# Patient Record
Sex: Female | Born: 1995 | Race: Black or African American | Hispanic: No | Marital: Single | Smoking: Never smoker
Health system: Southern US, Community
[De-identification: ages and names within clinical notes are randomized; demographics above are authoritative.]

## PROBLEM LIST (undated history)

## (undated) DIAGNOSIS — O139 Gestational [pregnancy-induced] hypertension without significant proteinuria, unspecified trimester: Secondary | ICD-10-CM

## (undated) DIAGNOSIS — O039 Complete or unspecified spontaneous abortion without complication: Secondary | ICD-10-CM

## (undated) HISTORY — DX: Complete or unspecified spontaneous abortion without complication: O03.9

## (undated) HISTORY — PX: DILATION AND CURETTAGE OF UTERUS: SHX78

## (undated) HISTORY — PX: WISDOM TOOTH EXTRACTION: SHX21

---

## 2014-09-08 ENCOUNTER — Encounter (HOSPITAL_COMMUNITY): Payer: Self-pay | Admitting: Nurse Practitioner

## 2014-09-08 ENCOUNTER — Emergency Department (HOSPITAL_COMMUNITY)
Admission: EM | Admit: 2014-09-08 | Discharge: 2014-09-08 | Disposition: A | Discharge status: Home / Self Care | Payer: Self-pay | Attending: Emergency Medicine | Admitting: Emergency Medicine

## 2014-09-08 DIAGNOSIS — R5383 Other fatigue: Secondary | ICD-10-CM | POA: Insufficient documentation

## 2014-09-08 DIAGNOSIS — R112 Nausea with vomiting, unspecified: Secondary | ICD-10-CM | POA: Insufficient documentation

## 2014-09-08 DIAGNOSIS — R51 Headache: Secondary | ICD-10-CM | POA: Insufficient documentation

## 2014-09-08 DIAGNOSIS — E860 Dehydration: Secondary | ICD-10-CM | POA: Insufficient documentation

## 2014-09-08 LAB — URINALYSIS, ROUTINE W REFLEX MICROSCOPIC
Bilirubin Urine: NEGATIVE
GLUCOSE, UA: NEGATIVE mg/dL
HGB URINE DIPSTICK: NEGATIVE
KETONES UR: 15 mg/dL — AB
NITRITE: NEGATIVE
PH: 6 (ref 5.0–8.0)
Protein, ur: NEGATIVE mg/dL
Specific Gravity, Urine: 1.026 (ref 1.005–1.030)
UROBILINOGEN UA: 0.2 mg/dL (ref 0.0–1.0)

## 2014-09-08 LAB — BASIC METABOLIC PANEL
Anion gap: 13 (ref 5–15)
BUN: 8 mg/dL (ref 6–20)
CO2: 21 mmol/L — ABNORMAL LOW (ref 22–32)
Calcium: 9.1 mg/dL (ref 8.9–10.3)
Chloride: 104 mmol/L (ref 101–111)
Creatinine, Ser: 1.12 mg/dL — ABNORMAL HIGH (ref 0.44–1.00)
GFR calc non Af Amer: >60 mL/min (ref >60)
Glucose, Bld: 136 mg/dL — ABNORMAL HIGH (ref 65–99)
Potassium: 3.9 mmol/L (ref 3.5–5.1)
Sodium: 138 mmol/L (ref 135–145)

## 2014-09-08 LAB — I-STAT CHEM 8, ED
BUN: 8 mg/dL (ref 6–20)
Calcium, Ion: 1.18 mmol/L (ref 1.12–1.23)
Chloride: 104 mmol/L (ref 101–111)
Creatinine, Ser: 1 mg/dL (ref 0.44–1.00)
Glucose, Bld: 132 mg/dL — ABNORMAL HIGH (ref 65–99)
HCT: 50 % — ABNORMAL HIGH (ref 36.0–46.0)
Hemoglobin: 17 g/dL — ABNORMAL HIGH (ref 12.0–15.0)
Potassium: 3.9 mmol/L (ref 3.5–5.1)
SODIUM: 141 mmol/L (ref 135–145)
TCO2: 20 mmol/L (ref 0–100)

## 2014-09-08 LAB — URINE MICROSCOPIC-ADD ON

## 2014-09-08 LAB — CBC
HEMATOCRIT: 43.5 % (ref 36.0–46.0)
HEMOGLOBIN: 14.5 g/dL (ref 12.0–15.0)
MCH: 25.8 pg — AB (ref 26.0–34.0)
MCHC: 33.3 g/dL (ref 30.0–36.0)
MCV: 77.5 fL — ABNORMAL LOW (ref 78.0–100.0)
Platelets: 279 10*3/uL (ref 150–400)
RBC: 5.61 MIL/uL — ABNORMAL HIGH (ref 3.87–5.11)
RDW: 13.6 % (ref 11.5–15.5)
WBC: 22.7 10*3/uL — ABNORMAL HIGH (ref 4.0–10.5)

## 2014-09-08 LAB — I-STAT BETA HCG BLOOD, ED (MC, WL, AP ONLY): I-stat hCG, quantitative: <5 m[IU]/mL (ref <5)

## 2014-09-08 MED ORDER — ONDANSETRON HCL 4 MG/2ML IJ SOLN
4.0000 mg | Freq: Once | INTRAMUSCULAR | Status: AC
  Administered 2014-09-08: 4 mg via INTRAVENOUS
  Filled 2014-09-08: qty 2

## 2014-09-08 MED ORDER — ONDANSETRON 4 MG PO TBDP
ORAL_TABLET | ORAL | Status: AC
  Filled 2014-09-08: qty 1

## 2014-09-08 MED ORDER — PROMETHAZINE HCL 25 MG/ML IJ SOLN
25.0000 mg | Freq: Once | INTRAMUSCULAR | Status: AC
  Administered 2014-09-08: 25 mg via INTRAVENOUS
  Filled 2014-09-08: qty 1

## 2014-09-08 MED ORDER — ONDANSETRON 4 MG PO TBDP
4.0000 mg | ORAL_TABLET | Freq: Once | ORAL | Status: AC | PRN
  Administered 2014-09-08: 4 mg via ORAL

## 2014-09-08 MED ORDER — ACETAMINOPHEN 500 MG PO TABS
1000.0000 mg | ORAL_TABLET | Freq: Once | ORAL | Status: AC
  Administered 2014-09-08: 1000 mg via ORAL
  Filled 2014-09-08: qty 2

## 2014-09-08 MED ORDER — ONDANSETRON 4 MG PO TBDP: ORAL_TABLET | ORAL | Status: DC

## 2014-09-08 MED ORDER — METOCLOPRAMIDE HCL 5 MG/ML IJ SOLN
10.0000 mg | Freq: Once | INTRAMUSCULAR | Status: AC
  Administered 2014-09-08: 10 mg via INTRAVENOUS

## 2014-09-08 MED ORDER — SODIUM CHLORIDE 0.9 % IV BOLUS (SEPSIS)
1000.0000 mL | Freq: Once | INTRAVENOUS | Status: AC
  Administered 2014-09-08: 1000 mL via INTRAVENOUS

## 2014-09-08 NOTE — ED Provider Notes (Signed)
CSN: [id number]     Arrival date & time 09/08/14  1809 History   First MD Initiated Contact with Patient 09/08/14 1830     Chief Complaint  Patient presents with  . Emesis     (Consider location/radiation/quality/duration/timing/severity/associated sxs/prior Treatment) HPI Comments: 19 year old female presenting with nausea and vomiting. Patient states she gave plasma earlier today, and then walked for about 2-3 hours in the heat without any water until she reached the tire store. While at the tire store, she started to feel nauseated, had 2 episodes of nonbloody, nonbilious emesis, developed generalized headache and fatigue and came to the ED where she had 2 more episodes of vomiting. Prior to giving plasma, she was feeling fine, and gives plasma frequently without any problem. She normally does not walk outside in the heat for hours after giving plasma. Denies abdominal pain, fever, chills, chest pain, shortness of breath, lightheadedness, dizziness or vision change.  Patient is a 19 y.o. female presenting with vomiting. The history is provided by the patient.  Emesis Associated symptoms: headaches     History reviewed. No pertinent past medical history. History reviewed. No pertinent past surgical history. History reviewed. No pertinent family history. History  Substance Use Topics  . Smoking status: Never Smoker   . Smokeless tobacco: Not on file  . Alcohol Use: No   OB History    No data available     Review of Systems  Constitutional: Positive for fatigue.  Gastrointestinal: Positive for nausea and vomiting.  Neurological: Positive for headaches.  All other systems reviewed and are negative.     Allergies  Review of patient's allergies indicates no known allergies.  Home Medications   Prior to Admission medications   Medication Sig Start Date End Date Taking? Authorizing Provider  etonogestrel-ethinyl estradiol (NUVARING) 0.12-0.015 MG/24HR vaginal ring Place 1  each vaginally every 28 (twenty-eight) days. Insert vaginally and leave in place for 3 consecutive weeks, then remove for 1 week.   Yes Historical Provider, MD  ondansetron (ZOFRAN ODT) 4 MG disintegrating tablet 4mg  ODT q4 hours prn nausea/vomit 09/08/14   Madylyn Insco M Clemma Johnsen, PA-C   BP 123/72 mmHg  Pulse 75  Temp(Src) 98 F (36.7 C) (Rectal)  Resp 16  SpO2 100% Physical Exam  Constitutional: She is oriented to person, place, and time. She appears well-developed and well-nourished. No distress.  HENT:  Head: Normocephalic and atraumatic.  Mouth/Throat: Oropharynx is clear and moist.  Eyes: Conjunctivae and EOM are normal. Pupils are equal, round, and reactive to light.  Neck: Normal range of motion. Neck supple.  Cardiovascular: Normal rate, regular rhythm and normal heart sounds.   Pulmonary/Chest: Effort normal and breath sounds normal. No respiratory distress.  Abdominal: Soft. Bowel sounds are normal. She exhibits no distension. There is no tenderness.  Musculoskeletal: Normal range of motion. She exhibits no edema.  Neurological: She is alert and oriented to person, place, and time. She has normal strength. No cranial nerve deficit or sensory deficit. Coordination normal. GCS eye subscore is 4. GCS verbal subscore is 5. GCS motor subscore is 6.  Speech fluent, goal oriented. Moves extremities without ataxia.  Skin: Skin is warm and dry.  Psychiatric: She has a normal mood and affect. Her behavior is normal.  Nursing note and vitals reviewed.   ED Course  Procedures (including critical care time) Labs Review Labs Reviewed  CBC - Abnormal; Notable for the following:    WBC 22.7 (*)    RBC 5.61 (*)  MCV 77.5 (*)    MCH 25.8 (*)    All other components within normal limits  URINALYSIS, ROUTINE W REFLEX MICROSCOPIC (NOT AT [facility]) - Abnormal; Notable for the following:    APPearance TURBID (*)    Ketones, ur 15 (*)    Leukocytes, UA SMALL (*)    All other components within normal  limits  BASIC METABOLIC PANEL - Abnormal; Notable for the following:    CO2 21 (*)    Glucose, Bld 136 (*)    Creatinine, Ser 1.12 (*)    All other components within normal limits  URINE MICROSCOPIC-ADD ON - Abnormal; Notable for the following:    Squamous Epithelial / LPF MANY (*)    Bacteria, UA MANY (*)    Casts GRANULAR CAST (*)    All other components within normal limits  I-STAT CHEM 8, ED - Abnormal; Notable for the following:    Glucose, Bld 132 (*)    Hemoglobin 17.0 (*)    HCT 50.0 (*)    All other components within normal limits  I-STAT BETA HCG BLOOD, ED ([facility], [facility], [facility] ONLY)    Imaging Review No results found.   EKG Interpretation None      MDM   Final diagnoses:  Dehydration  Non-intractable vomiting with nausea, vomiting of unspecified type   Non-toxic appearing, NAD. AFVSS. Pt with emesis and HA after walking in the heat for 2-3 hours. Most likely from dehydration. Will give IV fluids, zofran, and check basic labs. Abdomen soft and nontender.  Labs significant for a leukocytosis of 22.7, this is most likely from the dehydration along with a creatinine of 1.12. Patient given 2 L of IV fluids and antibiotics and is resting comfortably on exam bed. When waking patient up, she states she is feeling better. She is tolerating PO. Vital signs remain stable. Stable for discharge. Return precautions given. Patient states understanding of treatment care plan and is agreeable.  Discussed with attending Dr. Hyacinth Meeker who also evaluated patient and agrees with plan of care.  Kathrynn Speed, PA-C 09/08/14 2219  Eber Hong, MD 09/09/14 1040

## 2014-09-08 NOTE — Discharge Instructions (Signed)
Take Zofran as directed as needed for nausea. Stay well hydrated.  Dehydration, Adult Dehydration is when you lose more fluids from the body than you take in. Vital organs like the kidneys, brain, and heart cannot function without a proper amount of fluids and salt. Any loss of fluids from the body can cause dehydration.  CAUSES   Vomiting.  Diarrhea.  Excessive sweating.  Excessive urine output.  Fever. SYMPTOMS  Mild dehydration  Thirst.  Dry lips.  Slightly dry mouth. Moderate dehydration  Very dry mouth.  Sunken eyes.  Skin does not bounce back quickly when lightly pinched and released.  Dark urine and decreased urine production.  Decreased tear production.  Headache. Severe dehydration  Very dry mouth.  Extreme thirst.  Rapid, weak pulse (more than 100 beats per minute at rest).  Cold hands and feet.  Not able to sweat in spite of heat and temperature.  Rapid breathing.  Blue lips.  Confusion and lethargy.  Difficulty being awakened.  Minimal urine production.  No tears. DIAGNOSIS  Your caregiver will diagnose dehydration based on your symptoms and your exam. Blood and urine tests will help confirm the diagnosis. The diagnostic evaluation should also identify the cause of dehydration. TREATMENT  Treatment of mild or moderate dehydration can often be done at home by increasing the amount of fluids that you drink. It is best to drink small amounts of fluid more often. Drinking too much at one time can make vomiting worse. Refer to the home care instructions below. Severe dehydration needs to be treated at the hospital where you will probably be given intravenous (IV) fluids that contain water and electrolytes. HOME CARE INSTRUCTIONS   Ask your caregiver about specific rehydration instructions.  Drink enough fluids to keep your urine clear or pale yellow.  Drink small amounts frequently if you have nausea and vomiting.  Eat as you normally  do.  Avoid:  Foods or drinks high in sugar.  Carbonated drinks.  Juice.  Extremely hot or cold fluids.  Drinks with caffeine.  Fatty, greasy foods.  Alcohol.  Tobacco.  Overeating.  Gelatin desserts.  Wash your hands well to avoid spreading bacteria and viruses.  Only take over-the-counter or prescription medicines for pain, discomfort, or fever as directed by your caregiver.  Ask your caregiver if you should continue all prescribed and over-the-counter medicines.  Keep all follow-up appointments with your caregiver. SEEK MEDICAL CARE IF:  You have abdominal pain and it increases or stays in one area (localizes).  You have a rash, stiff neck, or severe headache.  You are irritable, sleepy, or difficult to awaken.  You are weak, dizzy, or extremely thirsty. SEEK IMMEDIATE MEDICAL CARE IF:   You are unable to keep fluids down or you get worse despite treatment.  You have frequent episodes of vomiting or diarrhea.  You have blood or green matter (bile) in your vomit.  You have blood in your stool or your stool looks black and tarry.  You have not urinated in 6 to 8 hours, or you have only urinated a small amount of very dark urine.  You have a fever.  You faint. MAKE SURE YOU:   Understand these instructions.  Will watch your condition.  Will get help right away if you are not doing well or get worse. Document Released: 02/03/2005 Document Revised: 04/28/2011 Document Reviewed: 09/23/2010 [facility] Patient Information 2015 [place], [place]. This information is not intended to replace advice given to you by your health care provider.  Make sure you discuss any questions you have with your health care provider. ° °

## 2014-09-08 NOTE — ED Notes (Addendum)
She gave plasma today then she walked in the heat 3 miles then began to have a headache and vomiting. She is alert, lying on the floor in triage heaving.

## 2014-09-08 NOTE — ED Notes (Signed)
The patient is aware that a urine specimen is needed. 

## 2014-09-08 NOTE — ED Notes (Signed)
We attempted orthostatic vitals but the patient was only able to do the laying portion.

## 2014-09-08 NOTE — ED Provider Notes (Signed)
19 year old female, presents with feeling lightheaded, nauseated, headache.  She denies any abdominal chest or back pain, has no neck stiffness, has persistent nausea and multiple episodes of vomiting which has occurred throughout the day. She denies any recent insect bites or swelling of the legs. There has been no diarrhea or dysuria. She states that she had a very small amount of food this morning followed by donating plasma for money, she then walked 3 miles in the hot sun where she became progressively more nauseated. On exam she has a soft nontender abdomen, clear heart and lung sounds, she appears tired but is able to follow my commands without difficult. She is persistently nauseated and feels as though she's been vomiting. Initial labs show significant leukocytosis of 22,000, check renal function, urinalysis, electrolytes, hydrate, reevaluate.  Medical screening examination/treatment/procedure(s) were conducted as a shared visit with non-physician practitioner(s) and myself.  I personally evaluated the patient during the encounter.  Clinical Impression:   Final diagnoses:  Dehydration  Non-intractable vomiting with nausea, vomiting of unspecified type          Eber Hong, MD 09/09/14 1040

## 2014-09-08 NOTE — ED Notes (Signed)
Pt reports nausea continues, states she hasn't been able to keep water down. No emesis seen by this RN this shift.

## 2014-09-08 NOTE — ED Notes (Addendum)
Prompted pt to provide urine sample, pt doesn't think she can go, given glass of water to drink and try later. Refsued in and out cath.

## 2016-12-05 ENCOUNTER — Encounter: Payer: Self-pay | Admitting: Family Medicine

## 2016-12-05 ENCOUNTER — Ambulatory Visit (INDEPENDENT_AMBULATORY_CARE_PROVIDER_SITE_OTHER): Payer: Medicaid Other | Admitting: Family Medicine

## 2016-12-05 ENCOUNTER — Other Ambulatory Visit (HOSPITAL_COMMUNITY)
Admission: RE | Admit: 2016-12-05 | Discharge: 2016-12-05 | Disposition: A | Discharge status: Home / Self Care | Payer: Medicaid Other | Source: Ambulatory Visit | Attending: Family Medicine | Admitting: Family Medicine

## 2016-12-05 ENCOUNTER — Inpatient Hospital Stay: Admission: RE | Admit: 2016-12-05 | Payer: Self-pay | Source: Ambulatory Visit

## 2016-12-05 DIAGNOSIS — Z3400 Encounter for supervision of normal first pregnancy, unspecified trimester: Secondary | ICD-10-CM | POA: Insufficient documentation

## 2016-12-05 DIAGNOSIS — Z113 Encounter for screening for infections with a predominantly sexual mode of transmission: Secondary | ICD-10-CM

## 2016-12-05 DIAGNOSIS — T7491XA Unspecified adult maltreatment, confirmed, initial encounter: Secondary | ICD-10-CM

## 2016-12-05 DIAGNOSIS — Z3401 Encounter for supervision of normal first pregnancy, first trimester: Secondary | ICD-10-CM

## 2016-12-05 DIAGNOSIS — Z124 Encounter for screening for malignant neoplasm of cervix: Secondary | ICD-10-CM

## 2016-12-05 NOTE — Patient Instructions (Signed)
First Trimester of Pregnancy The first trimester of pregnancy is from week 1 until the end of week 13 (months 1 through 3). A week after a sperm fertilizes an egg, the egg will implant on the wall of the uterus. This embryo will begin to develop into a baby. Genes from you and your partner will form the baby. The female genes will determine whether the baby will be a boy or a girl. At 6-8 weeks, the eyes and face will be formed, and the heartbeat can be seen on ultrasound. At the end of 12 weeks, all the baby's organs will be formed. Now that you are pregnant, you will want to do everything you can to have a healthy baby. Two of the most important things are to get good prenatal care and to follow your health care provider's instructions. Prenatal care is all the medical care you receive before the baby's birth. This care will help prevent, find, and treat any problems during the pregnancy and childbirth. Body changes during your first trimester Your body goes through many changes during pregnancy. The changes vary from woman to woman.  You may gain or lose a couple of pounds at first.  You may feel sick to your stomach (nauseous) and you may throw up (vomit). If the vomiting is uncontrollable, call your health care provider.  You may tire easily.  You may develop headaches that can be relieved by medicines. All medicines should be approved by your health care provider.  You may urinate more often. Painful urination may mean you have a bladder infection.  You may develop heartburn as a result of your pregnancy.  You may develop constipation because certain hormones are causing the muscles that push stool through your intestines to slow down.  You may develop hemorrhoids or swollen veins (varicose veins).  Your breasts may begin to grow larger and become tender. Your nipples may stick out more, and the tissue that surrounds them (areola) may become darker.  Your gums may bleed and may be  sensitive to brushing and flossing.  Dark spots or blotches (chloasma, mask of pregnancy) may develop on your face. This will likely fade after the baby is born.  Your menstrual periods will stop.  You may have a loss of appetite.  You may develop cravings for certain kinds of food.  You may have changes in your emotions from day to day, such as being excited to be pregnant or being concerned that something may go wrong with the pregnancy and baby.  You may have more vivid and strange dreams.  You may have changes in your hair. These can include thickening of your hair, rapid growth, and changes in texture. Some women also have hair loss during or after pregnancy, or hair that feels dry or thin. Your hair will most likely return to normal after your baby is born.  What to expect at prenatal visits During a routine prenatal visit:  You will be weighed to make sure you and the baby are growing normally.  Your blood pressure will be taken.  Your abdomen will be measured to track your baby's growth.  The fetal heartbeat will be listened to between weeks 10 and 14 of your pregnancy.  Test results from any previous visits will be discussed.  Your health care provider may ask you:  How you are feeling.  If you are feeling the baby move.  If you have had any abnormal symptoms, such as leaking fluid, bleeding, severe headaches,   or abdominal cramping.  If you are using any tobacco products, including cigarettes, chewing tobacco, and electronic cigarettes.  If you have any questions.  Other tests that may be performed during your first trimester include:  Blood tests to find your blood type and to check for the presence of any previous infections. The tests will also be used to check for low iron levels (anemia) and protein on red blood cells (Rh antibodies). Depending on your risk factors, or if you previously had diabetes during pregnancy, you may have tests to check for high blood  sugar that affects pregnant women (gestational diabetes).  Urine tests to check for infections, diabetes, or protein in the urine.  An ultrasound to confirm the proper growth and development of the baby.  Fetal screens for spinal cord problems (spina bifida) and Down syndrome.  HIV (human immunodeficiency virus) testing. Routine prenatal testing includes screening for HIV, unless you choose not to have this test.  You may need other tests to make sure you and the baby are doing well.  Follow these instructions at home: Medicines  Follow your health care provider's instructions regarding medicine use. Specific medicines may be either safe or unsafe to take during pregnancy.  Take a prenatal vitamin that contains at least 600 micrograms (mcg) of folic acid.  If you develop constipation, try taking a stool softener if your health care provider approves. Eating and drinking  Eat a balanced diet that includes fresh fruits and vegetables, whole grains, good sources of protein such as meat, eggs, or tofu, and low-fat dairy. Your health care provider will help you determine the amount of weight gain that is right for you.  Avoid raw meat and uncooked cheese. These carry germs that can cause birth defects in the baby.  Eating four or five small meals rather than three large meals a day may help relieve nausea and vomiting. If you start to feel nauseous, eating a few soda crackers can be helpful. Drinking liquids between meals, instead of during meals, also seems to help ease nausea and vomiting.  Limit foods that are high in fat and processed sugars, such as fried and sweet foods.  To prevent constipation: ? Eat foods that are high in fiber, such as fresh fruits and vegetables, whole grains, and beans. ? Drink enough fluid to keep your urine clear or pale yellow. Activity  Exercise only as directed by your health care provider. Most women can continue their usual exercise routine during  pregnancy. Try to exercise for 30 minutes at least 5 days a week. Exercising will help you: ? Control your weight. ? Stay in shape. ? Be prepared for labor and delivery.  Experiencing pain or cramping in the lower abdomen or lower back is a good sign that you should stop exercising. Check with your health care provider before continuing with normal exercises.  Try to avoid standing for long periods of time. Move your legs often if you must stand in one place for a long time.  Avoid heavy lifting.  Wear low-heeled shoes and practice good posture.  You may continue to have sex unless your health care provider tells you not to. Relieving pain and discomfort  Wear a good support bra to relieve breast tenderness.  Take warm sitz baths to soothe any pain or discomfort caused by hemorrhoids. Use hemorrhoid cream if your health care provider approves.  Rest with your legs elevated if you have leg cramps or low back pain.  If you develop   varicose veins in your legs, wear support hose. Elevate your feet for 15 minutes, 3-4 times a day. Limit salt in your diet. Prenatal care  Schedule your prenatal visits by the twelfth week of pregnancy. They are usually scheduled monthly at first, then more often in the last 2 months before delivery.  Write down your questions. Take them to your prenatal visits.  Keep all your prenatal visits as told by your health care provider. This is important. Safety  Wear your seat belt at all times when driving.  Make a list of emergency phone numbers, including numbers for family, friends, the hospital, and police and fire departments. General instructions  Ask your health care provider for a referral to a local prenatal education class. Begin classes no later than the beginning of month 6 of your pregnancy.  Ask for help if you have counseling or nutritional needs during pregnancy. Your health care provider can offer advice or refer you to specialists for help  with various needs.  Do not use hot tubs, steam rooms, or saunas.  Do not douche or use tampons or scented sanitary pads.  Do not cross your legs for long periods of time.  Avoid cat litter boxes and soil used by cats. These carry germs that can cause birth defects in the baby and possibly loss of the fetus by miscarriage or stillbirth.  Avoid all smoking, herbs, alcohol, and medicines not prescribed by your health care provider. Chemicals in these products affect the formation and growth of the baby.  Do not use any products that contain nicotine or tobacco, such as cigarettes and e-cigarettes. If you need help quitting, ask your health care provider. You may receive counseling support and other resources to help you quit.  Schedule a dentist appointment. At home, brush your teeth with a soft toothbrush and be gentle when you floss. Contact a health care provider if:  You have dizziness.  You have mild pelvic cramps, pelvic pressure, or nagging pain in the abdominal area.  You have persistent nausea, vomiting, or diarrhea.  You have a bad smelling vaginal discharge.  You have pain when you urinate.  You notice increased swelling in your face, hands, legs, or ankles.  You are exposed to fifth disease or chickenpox.  You are exposed to German measles (rubella) and have never had it. Get help right away if:  You have a fever.  You are leaking fluid from your vagina.  You have spotting or bleeding from your vagina.  You have severe abdominal cramping or pain.  You have rapid weight gain or loss.  You vomit blood or material that looks like coffee grounds.  You develop a severe headache.  You have shortness of breath.  You have any kind of trauma, such as from a fall or a car accident. Summary  The first trimester of pregnancy is from week 1 until the end of week 13 (months 1 through 3).  Your body goes through many changes during pregnancy. The changes vary from  woman to woman.  You will have routine prenatal visits. During those visits, your health care provider will examine you, discuss any test results you may have, and talk with you about how you are feeling. This information is not intended to replace advice given to you by your health care provider. Make sure you discuss any questions you have with your health care provider. Document Released: 01/28/2001 Document Revised: 01/16/2016 Document Reviewed: 01/16/2016 [employer] Interactive Patient Education  2017 [employer]  
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
## 2016-12-06 LAB — OBSTETRIC PANEL, INCLUDING HIV
Antibody Screen: NEGATIVE
Basophils Absolute: 0 10*3/uL (ref 0.0–0.2)
Basos: 0 %
EOS (ABSOLUTE): 0.1 10*3/uL (ref 0.0–0.4)
Eos: 1 %
HEP B S AG: NEGATIVE
HIV Screen 4th Generation wRfx: NONREACTIVE
Hematocrit: 37.2 % (ref 34.0–46.6)
Hemoglobin: 12.3 g/dL (ref 11.1–15.9)
IMMATURE GRANS (ABS): 0 10*3/uL (ref 0.0–0.1)
IMMATURE GRANULOCYTES: 1 %
LYMPHS: 26 %
Lymphocytes Absolute: 2.3 10*3/uL (ref 0.7–3.1)
MCH: 25.5 pg — ABNORMAL LOW (ref 26.6–33.0)
MCHC: 33.1 g/dL (ref 31.5–35.7)
MCV: 77 fL — ABNORMAL LOW (ref 79–97)
Monocytes Absolute: 0.4 10*3/uL (ref 0.1–0.9)
Monocytes: 4 %
NEUTROS PCT: 68 %
Neutrophils Absolute: 5.9 10*3/uL (ref 1.4–7.0)
Platelets: 294 10*3/uL (ref 150–379)
RBC: 4.82 x10E6/uL (ref 3.77–5.28)
RDW: 14.8 % (ref 12.3–15.4)
RPR Ser Ql: NONREACTIVE
Rh Factor: POSITIVE
Rubella Antibodies, IGG: 6.5 index (ref >0.99)
WBC: 8.6 10*3/uL (ref 3.4–10.8)

## 2016-12-06 LAB — HEMOGLOBIN A1C
Est. average glucose Bld gHb Est-mCnc: 103 mg/dL
HEMOGLOBIN A1C: 5.2 % (ref 4.8–5.6)

## 2016-12-08 LAB — CULTURE, URINE COMPREHENSIVE

## 2016-12-09 ENCOUNTER — Telehealth: Payer: Self-pay

## 2016-12-09 LAB — GC/CHLAMYDIA PROBE AMP (~~LOC~~) NOT AT ARMC
Chlamydia: NEGATIVE
Neisseria Gonorrhea: NEGATIVE

## 2016-12-09 LAB — CYTOLOGY - PAP: Diagnosis: NEGATIVE

## 2016-12-09 NOTE — Telephone Encounter (Signed)
Patient called stating she is 10 weeks and  has vomited today and saw blood in it. [facility]. Brandi StammerJennifer Howard RN BSN

## 2016-12-10 ENCOUNTER — Ambulatory Visit (INDEPENDENT_AMBULATORY_CARE_PROVIDER_SITE_OTHER): Payer: Medicaid Other

## 2016-12-10 VITALS — BP 112/68 | HR 78 | Wt 262.0 lb

## 2016-12-10 DIAGNOSIS — Z3687 Encounter for antenatal screening for uncertain dates: Secondary | ICD-10-CM

## 2016-12-10 DIAGNOSIS — Z3400 Encounter for supervision of normal first pregnancy, unspecified trimester: Secondary | ICD-10-CM

## 2016-12-10 DIAGNOSIS — Z3401 Encounter for supervision of normal first pregnancy, first trimester: Secondary | ICD-10-CM

## 2016-12-10 NOTE — Progress Notes (Signed)
Patient presents for viability ultrasound.  DATING AND VIABILITY SONOGRAM   Brandi Norris is a 21 y.o. year old G3P0020 with LMP Patient's last menstrual period was 09/27/2016 (exact date). which would correlate to  6827w4d weeks gestation.  She has regular menstrual cycles.   She is here today for a confirmatory initial sonogram.    GESTATION: singleton  FETAL ACTIVITY:          Heart rate        175 bpm          The fetus is active.  PLACENTA LOCALIZATION:  posterior   ADNEXA: The ovaries appear normal.   GESTATIONAL AGE AND  BIOMETRICS:  Gestational criteria: Estimated Date of Delivery: 07/04/17 by LMP now at 527w4d  Previous Scans:0      CROWN RUMP LENGTH           3.09 cm        10-1 weeks                                                                               AVERAGE EGA(BY THIS SCAN):  10-1 weeks  WORKING EDD( LMP ):  07-04-17   TECHNICIAN COMMENTS: Patient to return to office in four week for next OB visit. A copy of this report including all images has been saved and backed up to a second source for retrieval if needed. All measures and details of the anatomical scan, placentation, fluid volume and pelvic anatomy are contained in that report.  Armandina StammerJennifer Brittain Hosie 12/10/2016 10:11 AM

## 2017-01-02 ENCOUNTER — Encounter: Payer: Medicaid Other | Admitting: Family Medicine

## 2017-01-06 ENCOUNTER — Encounter: Payer: Self-pay | Admitting: Advanced Practice Midwife

## 2017-01-06 ENCOUNTER — Ambulatory Visit (INDEPENDENT_AMBULATORY_CARE_PROVIDER_SITE_OTHER): Payer: Medicaid Other | Admitting: Advanced Practice Midwife

## 2017-01-06 VITALS — BP 134/80 | HR 93 | Ht 66.0 in | Wt 254.0 lb

## 2017-01-06 DIAGNOSIS — Z3482 Encounter for supervision of other normal pregnancy, second trimester: Secondary | ICD-10-CM

## 2017-01-06 DIAGNOSIS — Z3490 Encounter for supervision of normal pregnancy, unspecified, unspecified trimester: Secondary | ICD-10-CM

## 2017-01-06 DIAGNOSIS — Z3400 Encounter for supervision of normal first pregnancy, unspecified trimester: Secondary | ICD-10-CM

## 2017-01-06 DIAGNOSIS — T7491XA Unspecified adult maltreatment, confirmed, initial encounter: Secondary | ICD-10-CM

## 2017-01-06 NOTE — Progress Notes (Signed)
.  mfm
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
## 2017-01-06 NOTE — Patient Instructions (Signed)
Second Trimester of Pregnancy The second trimester is from week 13 through week 28, month 4 through 6. This is often the time in pregnancy that you feel your best. Often times, morning sickness has lessened or quit. You may have more energy, and you may get hungry more often. Your unborn baby (fetus) is growing rapidly. At the end of the sixth month, he or she is about 9 inches long and weighs about 1 pounds. You will likely feel the baby move (quickening) between 18 and 20 weeks of pregnancy. Follow these instructions at home:  Avoid all smoking, herbs, and alcohol. Avoid drugs not approved by your doctor.  Do not use any tobacco products, including cigarettes, chewing tobacco, and electronic cigarettes. If you need help quitting, ask your doctor. You may get counseling or other support to help you quit.  Only take medicine as told by your doctor. Some medicines are safe and some are not during pregnancy.  Exercise only as told by your doctor. Stop exercising if you start having cramps.  Eat regular, healthy meals.  Wear a good support bra if your breasts are tender.  Do not use hot tubs, steam rooms, or saunas.  Wear your seat belt when driving.  Avoid raw meat, uncooked cheese, and liter boxes and soil used by cats.  Take your prenatal vitamins.  Take 1500-2000 milligrams of calcium daily starting at the 20th week of pregnancy until you deliver your baby.  Try taking medicine that helps you poop (stool softener) as needed, and if your doctor approves. Eat more fiber by eating fresh fruit, vegetables, and whole grains. Drink enough fluids to keep your pee (urine) clear or pale yellow.  Take warm water baths (sitz baths) to soothe pain or discomfort caused by hemorrhoids. Use hemorrhoid cream if your doctor approves.  If you have puffy, bulging veins (varicose veins), wear support hose. Raise (elevate) your feet for 15 minutes, 3-4 times a day. Limit salt in your diet.  Avoid heavy  lifting, wear low heals, and sit up straight.  Rest with your legs raised if you have leg cramps or low back pain.  Visit your dentist if you have not gone during your pregnancy. Use a soft toothbrush to brush your teeth. Be gentle when you floss.  You can have sex (intercourse) unless your doctor tells you not to.  Go to your doctor visits. Get help if:  You feel dizzy.  You have mild cramps or pressure in your lower belly (abdomen).  You have a nagging pain in your belly area.  You continue to feel sick to your stomach (nauseous), throw up (vomit), or have watery poop (diarrhea).  You have bad smelling fluid coming from your vagina.  You have pain with peeing (urination). Get help right away if:  You have a fever.  You are leaking fluid from your vagina.  You have spotting or bleeding from your vagina.  You have severe belly cramping or pain.  You lose or gain weight rapidly.  You have trouble catching your breath and have chest pain.  You notice sudden or extreme puffiness (swelling) of your face, hands, ankles, feet, or legs.  You have not felt the baby move in over an hour.  You have severe headaches that do not go away with medicine.  You have vision changes. This information is not intended to replace advice given to you by your health care provider. Make sure you discuss any questions you have with your health care   provider. Document Released: 04/30/2009 Document Revised: 07/12/2015 Document Reviewed: 04/06/2012 Elsevier Interactive Patient Education  2017 Elsevier Inc.  

## 2017-01-27 ENCOUNTER — Telehealth: Payer: Self-pay

## 2017-01-27 NOTE — Telephone Encounter (Signed)
Patient complaining of sharp pain in back and some numbness at times down one leg. Patient also complaining of vaginal pressure at times. Patient is 17.3 weeks. Denies any bleeding or leaking of fluid.  Explained saitica to the patient and round ligament pain and how these can cause pressure and pain she is describing. Encouraged patient to get off her feet as much as possible and encouraged to purchase a maternity belt and wear it. Brandi StammerJennifer Howard RNBSN

## 2017-01-29 ENCOUNTER — Encounter (HOSPITAL_COMMUNITY): Payer: Self-pay | Admitting: Advanced Practice Midwife

## 2017-01-30 ENCOUNTER — Ambulatory Visit (INDEPENDENT_AMBULATORY_CARE_PROVIDER_SITE_OTHER): Payer: Medicaid Other | Admitting: Obstetrics and Gynecology

## 2017-01-30 ENCOUNTER — Encounter: Payer: Self-pay | Admitting: Obstetrics and Gynecology

## 2017-01-30 ENCOUNTER — Encounter: Payer: Self-pay | Admitting: Family Medicine

## 2017-01-30 VITALS — BP 130/77 | HR 97 | Wt 250.0 lb

## 2017-01-30 DIAGNOSIS — Z3400 Encounter for supervision of normal first pregnancy, unspecified trimester: Secondary | ICD-10-CM

## 2017-01-30 MED ORDER — VITAFOL GUMMIES 3.33-0.333-34.8 MG PO CHEW: 2.0000 | CHEWABLE_TABLET | Freq: Every day | ORAL | 6 refills | Status: AC

## 2017-01-30 MED ORDER — COMFORT FIT MATERNITY SUPP MED MISC: 0 refills | Status: DC
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
## 2017-02-04 ENCOUNTER — Telehealth: Payer: Self-pay

## 2017-02-04 ENCOUNTER — Other Ambulatory Visit: Payer: Self-pay | Admitting: Obstetrics and Gynecology

## 2017-02-04 ENCOUNTER — Encounter: Payer: Self-pay | Admitting: Obstetrics and Gynecology

## 2017-02-04 ENCOUNTER — Encounter: Payer: Medicaid Other | Admitting: Obstetrics & Gynecology

## 2017-02-04 DIAGNOSIS — O280 Abnormal hematological finding on antenatal screening of mother: Secondary | ICD-10-CM | POA: Insufficient documentation

## 2017-02-04 DIAGNOSIS — Z3400 Encounter for supervision of normal first pregnancy, unspecified trimester: Secondary | ICD-10-CM

## 2017-02-04 LAB — AFP TETRA
DIA MOM VALUE: 3.16
DIA Value (EIA): 396.86 pg/mL
DSR (By Age)    1 IN: 1094
DSR (SECOND TRIMESTER) 1 IN: 252
Gestational Age: 17.6 WEEKS
MATERNAL AGE AT EDD: 23.3 a
MSAFP MOM: 1.34
MSAFP: 43.4 ng/mL
MSHCG Mom: 2.69
MSHCG: 55675 m[IU]/mL
Osb Risk: 8546
T18 (By Age): 1:4262 {titer}
Test Results:: POSITIVE — AB
WEIGHT: 250 [lb_av]
uE3 Mom: 0.69
uE3 Value: 0.73 ng/mL

## 2017-02-04 NOTE — Telephone Encounter (Signed)
Patient made aware of abnormal quad screen - that there is an increase risk of down syndrome. I explained that this does not mean that her baby has down syndrome but she will need genetic counseling at her appointment for her anatomy ultrasound on Friday. Patient crying on the phone. Listened to patient concerns and suggested she have a list of questions for the genetic counselor on Friday. Armandina StammerJennifer Howard RNBSN

## 2017-02-04 NOTE — Telephone Encounter (Signed)
-----   Message from Catalina AntiguaPeggy Constant, MD sent at 02/04/2017  8:38 AM EST ----- Please inform patient of abnormal quad screen- positive for down syndrome. Please explain that at this time we are not saying that her baby has down syndrome. All it means is that further evaluation via ultrasound and further blood test can be done to confirm this abnormal result.   Please add genetic counseling to her 12/21 ultrasound in order for her to review her options  Thanks  Gigi Gineggy

## 2017-02-06 ENCOUNTER — Ambulatory Visit (HOSPITAL_COMMUNITY)
Admission: RE | Admit: 2017-02-06 | Discharge: 2017-02-06 | Disposition: A | Discharge status: Home / Self Care | Payer: Medicaid Other | Source: Ambulatory Visit | Attending: Advanced Practice Midwife | Admitting: Advanced Practice Midwife

## 2017-02-06 ENCOUNTER — Encounter (HOSPITAL_COMMUNITY): Payer: Self-pay

## 2017-02-06 DIAGNOSIS — Z3A18 18 weeks gestation of pregnancy: Secondary | ICD-10-CM | POA: Insufficient documentation

## 2017-02-06 DIAGNOSIS — Z3490 Encounter for supervision of normal pregnancy, unspecified, unspecified trimester: Secondary | ICD-10-CM

## 2017-02-06 DIAGNOSIS — Z3689 Encounter for other specified antenatal screening: Secondary | ICD-10-CM | POA: Diagnosis present

## 2017-02-06 DIAGNOSIS — O99212 Obesity complicating pregnancy, second trimester: Secondary | ICD-10-CM | POA: Insufficient documentation

## 2017-02-06 DIAGNOSIS — O289 Unspecified abnormal findings on antenatal screening of mother: Secondary | ICD-10-CM

## 2017-02-06 DIAGNOSIS — O352XX0 Maternal care for (suspected) hereditary disease in fetus, not applicable or unspecified: Secondary | ICD-10-CM

## 2017-02-06 NOTE — Progress Notes (Signed)
Genetic Counseling  DOB: 1995-12-13 Referring Provider: Aviva SignsWilliams, Marie L, CNM Appointment Date: 02/06/2017 Attending: Particia NearingMartha Decker, MD  Ms. Brandi DecampDiosha Norris was seen for genetic counseling because of an increased risk for fetal Down syndrome based on a maternal serum Quad screen.  In summary:  Reviewed results of screening test  Increased risk for Down syndrome  Discussed additional screening options  NIPS - drawn today  Ultrasound - performed today  Discussed diagnostic testing options  Amniocentesis - declined  Reviewed family history concerns - family history of SCA and FOB is adopted  Discussed general population carrier screening options - drawn today  CF  SMA  Hemoglobinopathies  She was counseled regarding the screening result and the associated 1 in 252 risk for fetal Down syndrome.  We reviewed chromosomes, nondisjunction, and the common features and variable prognosis of Down syndrome.  In addition, we reviewed the screen adjusted reduction in risks for trisomy 18 and open neural tube defects.  We also discussed other explanations for a screen positive result including: a gestational dating error, differences in maternal metabolism, and normal variation.  We reviewed other available screening options including noninvasive prenatal screening (NIPS)/cell free DNA (cfDNA) screening, and detailed ultrasound.  She was counseled that screening tests are used to modify a patient's a priori risk for aneuploidy, typically based on age. This estimate provides a pregnancy specific risk assessment. We reviewed the benefits and limitations of each option. Specifically, we discussed the conditions for which each test screens, the detection rates, and false positive rates of each. She was also counseled regarding diagnostic testing via amniocentesis. We reviewed the approximate 1 in 300-500 risk for complications from amniocentesis, including spontaneous pregnancy loss. We discussed  the possible results that the tests might provide including: positive, negative, unanticipated, and no result. Finally, she was counseled regarding the cost of each option and potential out of pocket expenses. After consideration of all the options, she elected to proceed with NIPS.  Those results will be available in 8-10 days.    A complete ultrasound was performed today. The ultrasound report will be sent under separate cover. There were no visualized fetal anomalies or markers suggestive of aneuploidy. Diagnostic testing was declined today.  She understands that screening tests cannot rule out all birth defects or genetic syndromes. The patient was advised of this limitation and states she still does not want additional testing at this time.   Brandi Norris was provided with written information regarding cystic fibrosis (CF), spinal muscular atrophy (SMA) and hemoglobinopathies including the carrier frequency, availability of carrier screening and prenatal diagnosis if indicated.  In addition, we discussed that CF and hemoglobinopathies are routinely screened for as part of the [place] newborn screening panel.  After further discussion, she elected to pursue screening for CF, SMA and hemoglobinopathies.   Both family histories were reviewed and found to be contributory.  Brandi Norris reported that her father had twins from another relationship who both died of SIDS at 316 months of age.  Brandi Norris's mother had a daughter from another relationship who was stillborn at 766 months.  Brandi Norris believes that her mother, maternal grandmother and maternal great-grandmother all are carriers for sickle cell anemia.  Brandi Norris reported that the father of the baby is adopted and has no information on his biological family.  The remainder of the family histories were noncontributory for birth defects, intellectual disabilities or known genetic conditions. Without further information regarding the provided family history, an  accurate genetic risk  cannot be calculated. Further genetic counseling is warranted if more information is obtained.  Brandi Norris denied exposure to environmental toxins or chemical agents. She denied the use of alcohol, tobacco or street drugs. She denied significant viral illnesses during the course of her pregnancy. Her medical and surgical histories were noncontributory.   I counseled Brandi Norris for approximately 60 minutes regarding the above risks and available options.   Mady Gemmaaragh Kinda Pottle, MS,  Certified Genetic Counselor

## 2017-02-17 NOTE — L&D Delivery Note (Signed)
Delivery Note  Progressed quickly to complete dilation.and felt urge to push.  Vertex already quite low  Vitals:   07/01/17 0521 07/01/17 0531 07/01/17 0540 07/01/17 0544  BP: (!) 170/102 (!) 177/101 (!) 174/111 (!) 171/99  Pulse: (!) 110 (!) 118 (!) 117 (!) 118  Resp:  Temp:    99 F (37.2 C)  TempSrc:      SpO2:      Weight:      Height:       Nifedipine given for hypertension.  At 6:10 AM a viable and healthy female was delivered via Vaginal, Spontaneous (Presentation:LOA  ).  APGAR: , ; weight  .   Placenta status:Spontaneous and grossly intact with 3 vessel Cord:  with the following complications: tight nuchal cord x 2  Anesthesia:  Epidural Episiotomy: None Lacerations: 1st degree;Perineal Suture Repair: None, patient refused repair Est. Blood Loss (mL): 150  Mom to postpartum.  Baby to Couplet care / Skin to Skin.  Wynelle Bourgeois 07/01/2017, 6:57 AM  Please schedule this patient for Postpartum visit in: 1 week with the following provider: Any provider For C/S patients schedule nurse incision check in weeks 2 weeks: no High risk pregnancy complicated by: HTN Delivery mode:  SVD Anticipated Birth Control:  other/unsure PP Procedures needed: BP check  Schedule Integrated BH visit: no

## 2017-02-18 ENCOUNTER — Other Ambulatory Visit (HOSPITAL_COMMUNITY): Payer: Self-pay

## 2017-02-18 ENCOUNTER — Telehealth (HOSPITAL_COMMUNITY): Payer: Self-pay | Admitting: Genetics

## 2017-02-18 NOTE — Telephone Encounter (Signed)
Called Brandi Norris to discuss her prenatal cell free DNA test results.  Brandi Norris had Panorama testing through [facility].  Testing was offered because of an increased risk for Down syndrome from her Quad screen.   The patient was identified by name and DOB.  We reviewed that these are within normal limits, showing a less than 1 in 10,000 risk for trisomies 21, 18 and 13, and monosomy X (Turner syndrome).  In addition, the risk for triploidy and sex chromosome trisomies (47,XXX and 47,XXY) was also low risk.   We reviewed that this testing identifies > 99% of pregnancies with trisomy 2821, trisomy 5813, sex chromosome trisomies (47,XXX and 47,XXY), and triploidy. The detection rate for trisomy 18 is 96%.  The detection rate for monosomy X is ~92%.  The false positive rate is <0.1% for all conditions. Testing was also consistent with female fetal sex.  The patient did wish to know fetal sex.  She understands that this testing does not identify all genetic conditions.  All questions were answered to her satisfaction, she was encouraged to call with additional questions or concerns.  Brandi Gemmaaragh Valree Feild, MS Certified Genetic Counselor

## 2017-02-19 ENCOUNTER — Other Ambulatory Visit (HOSPITAL_COMMUNITY): Payer: Self-pay

## 2017-02-24 ENCOUNTER — Other Ambulatory Visit: Payer: Self-pay

## 2017-02-25 ENCOUNTER — Telehealth (HOSPITAL_COMMUNITY): Payer: Self-pay | Admitting: Genetics

## 2017-02-25 NOTE — Telephone Encounter (Signed)
Called Brandi Norris to discuss her carrier screening results. [name] had carrier screening for the ACOG recommended conditions (SMA, CF, and hemoglobinopathies) through [facility]. The patient was identified by name and DOB.  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  We discussed these conditions briefly.   We discussed that alpha thalassemia is caused by changes in the alpha globin gene. There are two alpha globin chains on each of a person's chromosome 16 (aa/aa). A person can be a carrier of one alpha gene mutation (aa/a-) or of more than one mutation. A person who carries one alpha gene mutation is considered to be a silent carrier and would have a normal hemoglobin electrophoresis and would not have anemia related to the deletion.  A person who carries two alpha globin gene mutations can either carry them in cis (both on the same chromosome aa/--) or in trans (on different chromosomes a-/a-) . This carrier state is often referred to as alpha thalassemia "minor" and would manifest as mild anemia, low MCV and a normal hemoglobin electrophoresis (Hb Barts may be present at birth).  Deletion of 3 alpha globin genes (a-/--) is referred to as Hemoglobin H (HbH) disease and manifests with a low MCV, moderate anemia and the presence of HbH on electrophoresis (in newborns it presents with HbBarts).  The most severe form of alpha thalassemia, hydrops fetalis with Hb Barts, is associated with an absence of alpha globin chain synthesis as a result of deletions of all four alpha globin genes (--/--).   She was counseled that as a silent carrier, the only reason she would be at risk to have a child with a clinically significant hemoglobinopathy would be if the father of the baby carried two alpha thalassemia mutations in cis or was a carrier for hemoglobin [place].      We also discussed spinal muscular  atrophy.  We discussed that the genetics of spinal muscular atrophy (SMA) are unusual in that the gene responsible for the condition (SMN1) may occur in two copies on a single chromosome. Typically, each person has a single copy of SMN1 on each of their chromosome 5's.  Some individuals will have two copies of SMN1 on each chromosome 5.  Screening for SMA carrier status is performed by dosage analysis and determining the number of copies of SMN1 that an individual has.  If an individual has only two copies of SMN1, further screening is performed to determine if they have a specific single nucleotide polymorphism ([id number]).  The presence of this polymorphism increases the chance that a person with two SMN1 copies actually has those two copies on the same chromosome (in cis) and is a carrier, rather than having them on two separate chromosomes (in trans) and is not a carrier.  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  Thus, the chance that she is a carrier for SMA is 1 in 92.  Without testing her partner, he would have the general population chance to be a carrier for SMA (1 in 25).  Thus, the overall chance to have an affected child is approximately 1 in 6800.  We discussed that if her partner elected to have screening they could come to our office and have his blood drawn or a saliva kit could be sent to him to mail back to the laboratory.  She will discuss this with  him and let us know how they wish to proceed.    After confirming [name]'s email address, the results were released to her online.  We reviewed that carrier screening does not detect all carriers of all of these conditions, but a normal result significantly decreases the likelihood of being a carrier, and therefore, the overall reproductive risk. We reviewed that [facility] sequences most of the genes, which is associated with a high detection rate for carriers, thus a negative screen is very reassuring. All  questions were answered to her satisfaction, she was encouraged to call with additional questions or concerns. ? Cam Hai, [place] Certified Genetic Counselor

## 2017-02-27 ENCOUNTER — Ambulatory Visit (INDEPENDENT_AMBULATORY_CARE_PROVIDER_SITE_OTHER): Payer: Medicaid Other | Admitting: Obstetrics & Gynecology

## 2017-02-27 VITALS — BP 118/66 | HR 96 | Wt 247.0 lb

## 2017-02-27 DIAGNOSIS — Z23 Encounter for immunization: Secondary | ICD-10-CM | POA: Diagnosis not present

## 2017-02-27 DIAGNOSIS — O280 Abnormal hematological finding on antenatal screening of mother: Secondary | ICD-10-CM

## 2017-02-27 DIAGNOSIS — Z3402 Encounter for supervision of normal first pregnancy, second trimester: Secondary | ICD-10-CM

## 2017-02-27 DIAGNOSIS — T7491XD Unspecified adult maltreatment, confirmed, subsequent encounter: Secondary | ICD-10-CM

## 2017-02-27 DIAGNOSIS — O99212 Obesity complicating pregnancy, second trimester: Secondary | ICD-10-CM

## 2017-02-27 DIAGNOSIS — Z3400 Encounter for supervision of normal first pregnancy, unspecified trimester: Secondary | ICD-10-CM
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
## 2017-03-02 ENCOUNTER — Encounter: Payer: Self-pay | Admitting: Advanced Practice Midwife

## 2017-03-02 DIAGNOSIS — D563 Thalassemia minor: Secondary | ICD-10-CM | POA: Insufficient documentation

## 2017-03-27 ENCOUNTER — Ambulatory Visit (INDEPENDENT_AMBULATORY_CARE_PROVIDER_SITE_OTHER): Payer: Medicaid Other | Admitting: Family Medicine

## 2017-03-27 VITALS — BP 106/64 | HR 95 | Wt 247.0 lb

## 2017-03-27 DIAGNOSIS — Z3400 Encounter for supervision of normal first pregnancy, unspecified trimester: Secondary | ICD-10-CM
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
## 2017-03-28 LAB — GLUCOSE TOLERANCE, 2 HOURS W/ 1HR
Glucose, 1 hour: 90 mg/dL (ref 65–179)
Glucose, 2 hour: 110 mg/dL (ref 65–152)
Glucose, Fasting: 77 mg/dL (ref 65–91)

## 2017-03-28 LAB — RPR: RPR Ser Ql: NONREACTIVE

## 2017-03-28 LAB — CBC
HEMOGLOBIN: 11.6 g/dL (ref 11.1–15.9)
Hematocrit: 35.2 % (ref 34.0–46.6)
MCH: 26.2 pg — AB (ref 26.6–33.0)
MCHC: 33 g/dL (ref 31.5–35.7)
MCV: 80 fL (ref 79–97)
Platelets: 255 10*3/uL (ref 150–379)
RBC: 4.43 x10E6/uL (ref 3.77–5.28)
RDW: 15 % (ref 12.3–15.4)
WBC: 9.4 10*3/uL (ref 3.4–10.8)

## 2017-03-28 LAB — HIV ANTIBODY (ROUTINE TESTING W REFLEX): HIV Screen 4th Generation wRfx: NONREACTIVE

## 2017-03-30 ENCOUNTER — Encounter: Payer: Self-pay | Admitting: Family Medicine

## 2017-04-24 ENCOUNTER — Ambulatory Visit (INDEPENDENT_AMBULATORY_CARE_PROVIDER_SITE_OTHER): Payer: Medicaid Other | Admitting: Family Medicine

## 2017-04-24 ENCOUNTER — Encounter: Payer: Self-pay | Admitting: Family Medicine

## 2017-04-24 VITALS — BP 103/67 | HR 82 | Wt 248.0 lb

## 2017-04-24 DIAGNOSIS — M9903 Segmental and somatic dysfunction of lumbar region: Secondary | ICD-10-CM

## 2017-04-24 DIAGNOSIS — O9989 Other specified diseases and conditions complicating pregnancy, childbirth and the puerperium: Secondary | ICD-10-CM

## 2017-04-24 DIAGNOSIS — M549 Dorsalgia, unspecified: Secondary | ICD-10-CM

## 2017-04-24 DIAGNOSIS — O280 Abnormal hematological finding on antenatal screening of mother: Secondary | ICD-10-CM

## 2017-04-24 DIAGNOSIS — O99213 Obesity complicating pregnancy, third trimester: Secondary | ICD-10-CM

## 2017-04-24 DIAGNOSIS — Z3400 Encounter for supervision of normal first pregnancy, unspecified trimester: Secondary | ICD-10-CM

## 2017-04-24 DIAGNOSIS — Z3483 Encounter for supervision of other normal pregnancy, third trimester: Secondary | ICD-10-CM
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
## 2017-04-30 ENCOUNTER — Encounter: Payer: Self-pay | Admitting: Family Medicine

## 2017-05-08 ENCOUNTER — Encounter: Payer: Self-pay | Admitting: Family Medicine

## 2017-05-08 ENCOUNTER — Ambulatory Visit (INDEPENDENT_AMBULATORY_CARE_PROVIDER_SITE_OTHER): Payer: Medicaid Other | Admitting: Family Medicine

## 2017-05-08 VITALS — BP 102/59 | HR 98 | Wt 248.0 lb

## 2017-05-08 DIAGNOSIS — Z3400 Encounter for supervision of normal first pregnancy, unspecified trimester: Secondary | ICD-10-CM
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
## 2017-05-22 ENCOUNTER — Encounter: Payer: Self-pay | Admitting: Family Medicine

## 2017-05-22 ENCOUNTER — Ambulatory Visit (INDEPENDENT_AMBULATORY_CARE_PROVIDER_SITE_OTHER): Payer: Medicaid Other | Admitting: Family Medicine

## 2017-05-22 VITALS — BP 130/89 | HR 89 | Wt 252.0 lb

## 2017-05-22 DIAGNOSIS — Z3403 Encounter for supervision of normal first pregnancy, third trimester: Secondary | ICD-10-CM

## 2017-05-22 DIAGNOSIS — Z23 Encounter for immunization: Secondary | ICD-10-CM | POA: Diagnosis not present

## 2017-05-22 DIAGNOSIS — O280 Abnormal hematological finding on antenatal screening of mother: Secondary | ICD-10-CM

## 2017-05-22 DIAGNOSIS — Z3400 Encounter for supervision of normal first pregnancy, unspecified trimester: Secondary | ICD-10-CM

## 2017-05-22 NOTE — Addendum Note (Signed)
Addended by: Anell BarrHOWARD, JENNIFER L on: 05/22/2017 10:04 AM   Modules accepted: Orders
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
## 2017-05-31 ENCOUNTER — Encounter: Payer: Self-pay | Admitting: Family Medicine

## 2017-06-01 MED ORDER — HYDROXYZINE HCL 25 MG PO TABS
25.0000 mg | ORAL_TABLET | Freq: Four times a day (QID) | ORAL | 2 refills | Status: DC | PRN
Start: 2017-06-01 — End: 2017-07-10

## 2017-06-01 NOTE — Telephone Encounter (Signed)
Pt complaining of itching. I've asked her to come to the office today for bloodwork - she would need a CMP and bile acids. Thanks.

## 2017-06-02 ENCOUNTER — Ambulatory Visit (INDEPENDENT_AMBULATORY_CARE_PROVIDER_SITE_OTHER): Payer: Medicaid Other | Admitting: Nurse Practitioner

## 2017-06-02 ENCOUNTER — Other Ambulatory Visit (HOSPITAL_COMMUNITY)
Admission: RE | Admit: 2017-06-02 | Discharge: 2017-06-02 | Disposition: A | Discharge status: Home / Self Care | Payer: Medicaid Other | Source: Ambulatory Visit | Attending: Nurse Practitioner | Admitting: Nurse Practitioner

## 2017-06-02 VITALS — BP 112/79 | HR 83 | Wt 258.0 lb

## 2017-06-02 DIAGNOSIS — Z3400 Encounter for supervision of normal first pregnancy, unspecified trimester: Secondary | ICD-10-CM

## 2017-06-02 DIAGNOSIS — Z3490 Encounter for supervision of normal pregnancy, unspecified, unspecified trimester: Secondary | ICD-10-CM | POA: Insufficient documentation

## 2017-06-02 DIAGNOSIS — Z3403 Encounter for supervision of normal first pregnancy, third trimester: Secondary | ICD-10-CM
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
## 2017-06-02 NOTE — Patient Instructions (Signed)
Braxton Hicks Contractions °Contractions of the uterus can occur throughout pregnancy, but they are not always a sign that you are in labor. You may have practice contractions called Braxton Hicks contractions. These false labor contractions are sometimes confused with true labor. °What are Braxton Hicks contractions? °Braxton Hicks contractions are tightening movements that occur in the muscles of the uterus before labor. Unlike true labor contractions, these contractions do not result in opening (dilation) and thinning of the cervix. Toward the end of pregnancy (32-34 weeks), Braxton Hicks contractions can happen more often and may become stronger. These contractions are sometimes difficult to tell apart from true labor because they can be very uncomfortable. You should not feel embarrassed if you go to the hospital with false labor. °Sometimes, the only way to tell if you are in true labor is for your health care provider to look for changes in the cervix. The health care provider will do a physical exam and may monitor your contractions. If you are not in true labor, the exam should show that your cervix is not dilating and your water has not broken. °If there are other health problems associated with your pregnancy, it is completely safe for you to be sent home with false labor. You may continue to have Braxton Hicks contractions until you go into true labor. °How to tell the difference between true labor and false labor °True labor °· Contractions last 30-70 seconds. °· Contractions become very regular. °· Discomfort is usually felt in the top of the uterus, and it spreads to the lower abdomen and low back. °· Contractions do not go away with walking. °· Contractions usually become more intense and increase in frequency. °· The cervix dilates and gets thinner. °False labor °· Contractions are usually shorter and not as strong as true labor contractions. °· Contractions are usually irregular. °· Contractions  are often felt in the front of the lower abdomen and in the groin. °· Contractions may go away when you walk around or change positions while lying down. °· Contractions get weaker and are shorter-lasting as time goes on. °· The cervix usually does not dilate or become thin. °Follow these instructions at home: °· Take over-the-counter and prescription medicines only as told by your health care provider. °· Keep up with your usual exercises and follow other instructions from your health care provider. °· Eat and drink lightly if you think you are going into labor. °· If Braxton Hicks contractions are making you uncomfortable: °? Change your position from lying down or resting to walking, or change from walking to resting. °? Sit and rest in a tub of warm water. °? Drink enough fluid to keep your urine pale yellow. Dehydration may cause these contractions. °? Do slow and deep breathing several times an hour. °· Keep all follow-up prenatal visits as told by your health care provider. This is important. °Contact a health care provider if: °· You have a fever. °· You have continuous pain in your abdomen. °Get help right away if: °· Your contractions become stronger, more regular, and closer together. °· You have fluid leaking or gushing from your vagina. °· You pass blood-tinged mucus (bloody show). °· You have bleeding from your vagina. °· You have low back pain that you never had before. °· You feel your baby’s head pushing down and causing pelvic pressure. °· Your baby is not moving inside you as much as it used to. °Summary °· Contractions that occur before labor are called Braxton   Hicks contractions, false labor, or practice contractions. °· Braxton Hicks contractions are usually shorter, weaker, farther apart, and less regular than true labor contractions. True labor contractions usually become progressively stronger and regular and they become more frequent. °· Manage discomfort from Braxton Hicks contractions by  changing position, resting in a warm bath, drinking plenty of water, or practicing deep breathing. °This information is not intended to replace advice given to you by your health care provider. Make sure you discuss any questions you have with your health care provider. °Document Released: 06/19/2016 Document Revised: 06/19/2016 Document Reviewed: 06/19/2016 °[employer]vier Interactive Patient Education © 2018 [employer] ° °

## 2017-06-03 LAB — GC/CHLAMYDIA PROBE AMP (~~LOC~~) NOT AT ARMC
CHLAMYDIA, DNA PROBE: NEGATIVE
NEISSERIA GONORRHEA: NEGATIVE

## 2017-06-05 LAB — CULTURE, BETA STREP (GROUP B ONLY): Strep Gp B Culture: POSITIVE — AB

## 2017-06-05 LAB — BILE ACIDS, TOTAL: BILE ACIDS TOTAL: 9.5 umol/L (ref 4.7–24.5)

## 2017-06-09 ENCOUNTER — Ambulatory Visit (INDEPENDENT_AMBULATORY_CARE_PROVIDER_SITE_OTHER): Payer: Medicaid Other | Admitting: Advanced Practice Midwife

## 2017-06-09 VITALS — BP 126/80 | HR 90 | Wt 262.0 lb

## 2017-06-09 DIAGNOSIS — Z3400 Encounter for supervision of normal first pregnancy, unspecified trimester: Secondary | ICD-10-CM

## 2017-06-09 DIAGNOSIS — L298 Other pruritus: Secondary | ICD-10-CM
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
## 2017-06-09 NOTE — Patient Instructions (Signed)

## 2017-06-10 LAB — BILE ACIDS, TOTAL: Bile Acids Total: 9.3 umol/L (ref 4.7–24.5)

## 2017-06-11 ENCOUNTER — Telehealth: Payer: Self-pay | Admitting: Advanced Practice Midwife

## 2017-06-11 NOTE — Telephone Encounter (Signed)
Called pt to inform her of normal Bile Acid results of 9.3.  Discussed with pt that no diagnosis of cholestasis currently.  Will continue to monitor. Pt to notify office with increased itching.

## 2017-06-16 ENCOUNTER — Ambulatory Visit (INDEPENDENT_AMBULATORY_CARE_PROVIDER_SITE_OTHER): Payer: Medicaid Other | Admitting: Advanced Practice Midwife

## 2017-06-16 ENCOUNTER — Encounter: Payer: Self-pay | Admitting: Advanced Practice Midwife

## 2017-06-16 VITALS — BP 110/64 | HR 90 | Wt 259.0 lb

## 2017-06-16 DIAGNOSIS — Z3400 Encounter for supervision of normal first pregnancy, unspecified trimester: Secondary | ICD-10-CM
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
## 2017-06-16 NOTE — Progress Notes (Signed)
Repeat bp 110/64  And patient will return for a bp check. Armandina Stammer RN

## 2017-06-16 NOTE — Patient Instructions (Signed)

## 2017-06-19 ENCOUNTER — Ambulatory Visit: Payer: Medicaid Other

## 2017-06-19 VITALS — BP 126/77 | HR 72

## 2017-06-19 DIAGNOSIS — Z3400 Encounter for supervision of normal first pregnancy, unspecified trimester: Secondary | ICD-10-CM

## 2017-06-23 ENCOUNTER — Ambulatory Visit (INDEPENDENT_AMBULATORY_CARE_PROVIDER_SITE_OTHER): Payer: Medicaid Other | Admitting: Advanced Practice Midwife

## 2017-06-23 VITALS — BP 127/80 | HR 69 | Wt 261.0 lb

## 2017-06-23 DIAGNOSIS — Z3A39 39 weeks gestation of pregnancy: Secondary | ICD-10-CM

## 2017-06-23 DIAGNOSIS — D563 Thalassemia minor: Secondary | ICD-10-CM

## 2017-06-23 DIAGNOSIS — O280 Abnormal hematological finding on antenatal screening of mother: Secondary | ICD-10-CM

## 2017-06-23 DIAGNOSIS — Z3A38 38 weeks gestation of pregnancy: Secondary | ICD-10-CM

## 2017-06-23 DIAGNOSIS — O2613 Low weight gain in pregnancy, third trimester: Secondary | ICD-10-CM

## 2017-06-23 DIAGNOSIS — Z3400 Encounter for supervision of normal first pregnancy, unspecified trimester: Secondary | ICD-10-CM
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
## 2017-06-23 NOTE — Progress Notes (Signed)
Patient confirmed vertex via ultrasound. Armandina Stammer RN

## 2017-06-24 ENCOUNTER — Encounter: Payer: Self-pay | Admitting: Advanced Practice Midwife

## 2017-06-26 ENCOUNTER — Ambulatory Visit (HOSPITAL_COMMUNITY)
Admission: RE | Admit: 2017-06-26 | Discharge: 2017-06-26 | Disposition: A | Discharge status: Home / Self Care | Payer: Medicaid Other | Source: Ambulatory Visit | Attending: Advanced Practice Midwife | Admitting: Advanced Practice Midwife

## 2017-06-26 ENCOUNTER — Encounter (HOSPITAL_COMMUNITY): Payer: Self-pay

## 2017-06-26 DIAGNOSIS — O99213 Obesity complicating pregnancy, third trimester: Secondary | ICD-10-CM | POA: Insufficient documentation

## 2017-06-26 DIAGNOSIS — Z3A38 38 weeks gestation of pregnancy: Secondary | ICD-10-CM | POA: Diagnosis not present

## 2017-06-26 DIAGNOSIS — Z362 Encounter for other antenatal screening follow-up: Secondary | ICD-10-CM | POA: Diagnosis not present

## 2017-06-26 DIAGNOSIS — O2613 Low weight gain in pregnancy, third trimester: Secondary | ICD-10-CM

## 2017-06-26 DIAGNOSIS — O289 Unspecified abnormal findings on antenatal screening of mother: Secondary | ICD-10-CM | POA: Insufficient documentation

## 2017-06-26 DIAGNOSIS — O280 Abnormal hematological finding on antenatal screening of mother: Secondary | ICD-10-CM

## 2017-06-26 DIAGNOSIS — O26843 Uterine size-date discrepancy, third trimester: Secondary | ICD-10-CM | POA: Diagnosis not present

## 2017-06-26 DIAGNOSIS — O133 Gestational [pregnancy-induced] hypertension without significant proteinuria, third trimester: Secondary | ICD-10-CM | POA: Diagnosis not present

## 2017-06-26 DIAGNOSIS — Z3A39 39 weeks gestation of pregnancy: Secondary | ICD-10-CM

## 2017-06-26 DIAGNOSIS — Z3400 Encounter for supervision of normal first pregnancy, unspecified trimester: Secondary | ICD-10-CM

## 2017-06-26 DIAGNOSIS — D563 Thalassemia minor: Secondary | ICD-10-CM

## 2017-06-29 ENCOUNTER — Telehealth: Payer: Self-pay

## 2017-06-29 ENCOUNTER — Inpatient Hospital Stay (HOSPITAL_COMMUNITY)
Admission: AD | Admit: 2017-06-29 | Discharge: 2017-07-03 | DRG: 807 | Disposition: A | Discharge status: Home / Self Care | Payer: Medicaid Other | Source: Ambulatory Visit | Attending: Obstetrics and Gynecology | Admitting: Obstetrics and Gynecology

## 2017-06-29 ENCOUNTER — Encounter (HOSPITAL_COMMUNITY): Payer: Self-pay | Admitting: *Deleted

## 2017-06-29 DIAGNOSIS — O134 Gestational [pregnancy-induced] hypertension without significant proteinuria, complicating childbirth: Principal | ICD-10-CM | POA: Diagnosis present

## 2017-06-29 DIAGNOSIS — D563 Thalassemia minor: Secondary | ICD-10-CM | POA: Diagnosis present

## 2017-06-29 DIAGNOSIS — Z3A39 39 weeks gestation of pregnancy: Secondary | ICD-10-CM | POA: Diagnosis not present

## 2017-06-29 DIAGNOSIS — O99824 Streptococcus B carrier state complicating childbirth: Secondary | ICD-10-CM | POA: Diagnosis present

## 2017-06-29 DIAGNOSIS — O280 Abnormal hematological finding on antenatal screening of mother: Secondary | ICD-10-CM

## 2017-06-29 DIAGNOSIS — O139 Gestational [pregnancy-induced] hypertension without significant proteinuria, unspecified trimester: Secondary | ICD-10-CM | POA: Diagnosis present

## 2017-06-29 DIAGNOSIS — O99214 Obesity complicating childbirth: Secondary | ICD-10-CM | POA: Diagnosis present

## 2017-06-29 DIAGNOSIS — O133 Gestational [pregnancy-induced] hypertension without significant proteinuria, third trimester: Secondary | ICD-10-CM

## 2017-06-29 LAB — CBC WITH DIFFERENTIAL/PLATELET
BASOS PCT: 0 %
Basophils Absolute: 0 10*3/uL (ref 0.0–0.1)
EOS ABS: 0 10*3/uL (ref 0.0–0.7)
Eosinophils Relative: 0 %
HCT: 33 % — ABNORMAL LOW (ref 36.0–46.0)
HEMOGLOBIN: 10.9 g/dL — AB (ref 12.0–15.0)
Lymphocytes Relative: 22 %
Lymphs Abs: 2.7 10*3/uL (ref 0.7–4.0)
MCH: 25.8 pg — ABNORMAL LOW (ref 26.0–34.0)
MCHC: 33 g/dL (ref 30.0–36.0)
MCV: 78.2 fL (ref 78.0–100.0)
Monocytes Absolute: 0.2 10*3/uL (ref 0.1–1.0)
Monocytes Relative: 2 %
NEUTROS PCT: 76 %
Neutro Abs: 9 10*3/uL — ABNORMAL HIGH (ref 1.7–7.7)
Platelets: 275 10*3/uL (ref 150–400)
RBC: 4.22 MIL/uL (ref 3.87–5.11)
RDW: 14.3 % (ref 11.5–15.5)
WBC: 11.9 10*3/uL — AB (ref 4.0–10.5)

## 2017-06-29 LAB — COMPREHENSIVE METABOLIC PANEL
ALBUMIN: 3.1 g/dL — AB (ref 3.5–5.0)
ALK PHOS: 115 U/L (ref 38–126)
ALT: 10 U/L — AB (ref 14–54)
AST: 19 U/L (ref 15–41)
Anion gap: 10 (ref 5–15)
BUN: 10 mg/dL (ref 6–20)
CALCIUM: 9.2 mg/dL (ref 8.9–10.3)
CO2: 20 mmol/L — AB (ref 22–32)
Chloride: 105 mmol/L (ref 101–111)
Creatinine, Ser: 0.9 mg/dL (ref 0.44–1.00)
GFR calc Af Amer: >60 mL/min (ref >60)
GFR calc non Af Amer: >60 mL/min (ref >60)
GLUCOSE: 89 mg/dL (ref 65–99)
Potassium: 4 mmol/L (ref 3.5–5.1)
Sodium: 135 mmol/L (ref 135–145)
Total Bilirubin: 0.3 mg/dL (ref 0.3–1.2)
Total Protein: 6.6 g/dL (ref 6.5–8.1)

## 2017-06-29 LAB — URINALYSIS, ROUTINE W REFLEX MICROSCOPIC
Bilirubin Urine: NEGATIVE
Glucose, UA: NEGATIVE mg/dL
Hgb urine dipstick: NEGATIVE
KETONES UR: 20 mg/dL — AB
LEUKOCYTES UA: NEGATIVE
Nitrite: NEGATIVE
Protein, ur: 30 mg/dL — AB
Specific Gravity, Urine: 1.026 (ref 1.005–1.030)
pH: 5 (ref 5.0–8.0)

## 2017-06-29 LAB — TYPE AND SCREEN
ABO/RH(D): O POS
Antibody Screen: NEGATIVE

## 2017-06-29 LAB — PROTEIN / CREATININE RATIO, URINE
Creatinine, Urine: 558 mg/dL
Protein Creatinine Ratio: 0.06 mg/mg{Cre} (ref 0.00–0.15)
TOTAL PROTEIN, URINE: 32 mg/dL

## 2017-06-29 LAB — ABO/RH: ABO/RH(D): O POS

## 2017-06-29 MED ORDER — OXYCODONE-ACETAMINOPHEN 5-325 MG PO TABS: 1.0000 | ORAL_TABLET | ORAL | Status: DC | PRN

## 2017-06-29 MED ORDER — SOD CITRATE-CITRIC ACID 500-334 MG/5ML PO SOLN: 30.0000 mL | ORAL | Status: DC | PRN

## 2017-06-29 MED ORDER — LACTATED RINGERS IV SOLN
INTRAVENOUS | Status: DC
  Administered 2017-06-29 – 2017-07-01 (×4): via INTRAVENOUS

## 2017-06-29 MED ORDER — ACETAMINOPHEN 325 MG PO TABS: 650.0000 mg | ORAL_TABLET | ORAL | Status: DC | PRN

## 2017-06-29 MED ORDER — MISOPROSTOL 50MCG HALF TABLET
50.0000 ug | ORAL_TABLET | ORAL | Status: DC
  Administered 2017-06-29 – 2017-06-30 (×2): 50 ug via ORAL
  Filled 2017-06-29 (×2): qty 1

## 2017-06-29 MED ORDER — ONDANSETRON HCL 4 MG/2ML IJ SOLN
4.0000 mg | Freq: Four times a day (QID) | INTRAMUSCULAR | Status: DC | PRN
  Administered 2017-06-30: 4 mg via INTRAVENOUS
  Filled 2017-06-29: qty 2

## 2017-06-29 MED ORDER — PENICILLIN G POT IN DEXTROSE 60000 UNIT/ML IV SOLN
3.0000 10*6.[IU] | INTRAVENOUS | Status: DC
  Administered 2017-06-29 – 2017-07-01 (×8): 3 10*6.[IU] via INTRAVENOUS
  Filled 2017-06-29 (×11): qty 50

## 2017-06-29 MED ORDER — OXYTOCIN BOLUS FROM INFUSION
500.0000 mL | Freq: Once | INTRAVENOUS | Status: AC
  Administered 2017-07-01: 500 mL via INTRAVENOUS

## 2017-06-29 MED ORDER — LIDOCAINE HCL (PF) 1 % IJ SOLN
30.0000 mL | INTRAMUSCULAR | Status: DC | PRN
  Filled 2017-06-29: qty 30

## 2017-06-29 MED ORDER — OXYCODONE-ACETAMINOPHEN 5-325 MG PO TABS: 2.0000 | ORAL_TABLET | ORAL | Status: DC | PRN

## 2017-06-29 MED ORDER — LACTATED RINGERS IV SOLN
500.0000 mL | INTRAVENOUS | Status: DC | PRN
  Administered 2017-06-30: 500 mL via INTRAVENOUS

## 2017-06-29 MED ORDER — SODIUM CHLORIDE 0.9 % IV SOLN
5.0000 10*6.[IU] | Freq: Once | INTRAVENOUS | Status: AC
  Administered 2017-06-29: 5 10*6.[IU] via INTRAVENOUS
  Filled 2017-06-29: qty 5

## 2017-06-29 MED ORDER — OXYTOCIN 40 UNITS IN LACTATED RINGERS INFUSION - SIMPLE MED
2.5000 [IU]/h | INTRAVENOUS | Status: DC
  Administered 2017-07-01: 2.5 [IU]/h via INTRAVENOUS
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
## 2017-06-29 NOTE — Progress Notes (Signed)
Patient ID: Brandi Norris, female   DOB: 17-Jun-1995, 22 y.o.   MRN: [id number] Doing well, willing to try Foley and Cytotec now  Vitals:   06/29/17 1800 06/29/17 1801 06/29/17 1834 06/29/17 2109  BP: 129/77  118/71 (!) 149/95  Pulse:  (!) 101 89 85  Resp:   20 18  Temp:      TempSrc:      SpO2:      Weight:      Height:       FHR reassuring UCs not tracing  Dilation: 1 Effacement (%): Thick Station: -3 Presentation: Vertex Exam by:: Wynelle Bourgeois, CNM  Attempted Foley insertion, unsuccessful, suspect inner os is closed  Will start PO Cytotec and try again later

## 2017-06-29 NOTE — Telephone Encounter (Signed)
Pt called the office stating that she went to [facility] for decreased fetal movements. Pt states that her blood pressure was checked seven times and each reading was around 145/94 and 145/95. Pt states that she had a small amount of protein in her urine. Pt also states that she is upset because the doctors at [facility] told her that they will have to induce her. Pt states that she does not like that hospital and wants to give birth at one of [facility]. Pt advised to wait until the doctor gives her further instructions. I informed pt that she should not leave until she is cleared the doctors at [facility]. Pt verbalized understanding and had no questions.

## 2017-06-29 NOTE — Telephone Encounter (Signed)
Patient called and complaining of shooting pain down one leg yesterday. Patient also complaining of decreased fetal movement. Patient called last night and spoke with afters hours nurse that instructed her to go to Labor and deliver for evaluation.   Patient states she didn't go for fear of them inducing her. Explained to patient it is important to go for decreased fetal movement now and let them evaluate her. Patient states understanding and agrees to go. Armandina Stammer RN

## 2017-06-29 NOTE — MAU Note (Signed)
Pt reports she went to [facility] for decreased fetal movement at 11am today and her b/p was consistently elevated and they wanted to induce her there but she chose to come here because this is where she got her care.

## 2017-06-29 NOTE — Anesthesia Pain Management Evaluation Note (Signed)
  CRNA Pain Management Visit Note  Patient: Brandi Norris, 22 y.o., female  "Hello I am a member of the anesthesia team at [facility]. We have an anesthesia team available at all times to provide care throughout the hospital, including epidural management and anesthesia for C-section. I don't know your plan for the delivery whether it a natural birth, water birth, IV sedation, nitrous supplementation, doula or epidural, but we want to meet your pain goals."   1.Was your pain managed to your expectations on prior hospitalizations?   No prior hospitalizations  2.What is your expectation for pain management during this hospitalization?     Water tub  3.How can we help you reach that goal? Be available if needed  Record the patient's initial score and the patient's pain goal.   Pain: 2  Pain Goal: 8 [facility] wants you to be able to say your pain was always managed very well.  Brandi Norris 06/29/2017

## 2017-06-29 NOTE — MAU Provider Note (Signed)
History     CSN: [id number]  Arrival date and time: 06/29/17 1701   First Provider Initiated Contact with Patient 06/29/17 1743      Chief Complaint  Patient presents with  . Hypertension   HPI   Ms.Brandi Norris is a 22 y.o. female [id number] [redacted]w[redacted]d here in MAU with elevated BP readings. She was experiencing decreased fetal movement; she went to [facility] because it was closer. Upon arrival around 1130 her BP was elevated. She was instructed to stay however the patient wants to deliver at [facility]. She says her BP was elevated the whole time she was there. All of the readings were greater than 140/90's. No HA, + fetal movement.   OB History    Gravida  3   Para      Term      Preterm      AB  2   Living        SAB  2   TAB      Ectopic      Multiple      Live Births              Past Medical History:  Diagnosis Date  . Miscarriage     Past Surgical History:  Procedure Laterality Date  . DILATION AND CURETTAGE OF UTERUS    . WISDOM TOOTH EXTRACTION      Family History  Problem Relation Age of Onset  . Rheum arthritis Mother   . Stroke Mother   . Fibromyalgia Mother   . Neuropathy Mother   . Bursitis Mother   . Aneurysm Father   . Brain cancer Maternal Aunt   . Throat cancer Maternal Uncle   . Breast cancer Paternal Aunt   . Diabetes Maternal Grandmother   . Hypertension Maternal Grandmother   . Heart disease Paternal Grandmother     Social History   Tobacco Use  . Smoking status: Never Smoker  . Smokeless tobacco: Never Used  Substance Use Topics  . Alcohol use: No  . Drug use: No    Allergies: No Known Allergies  Medications Prior to Admission  Medication Sig Dispense Refill Last Dose  . Elastic Bandages & Supports (COMFORT FIT MATERNITY SUPP MED) MISC Wear daily when ambulating (Patient not taking: Reported on 06/02/2017) 1 each 0 Not Taking  . hydrOXYzine (ATARAX/VISTARIL) 25 MG tablet Take 1 tablet (25 mg total) by  mouth every 6 (six) hours as needed for itching. 30 tablet 2 Taking  . Prenatal Vit-Fe Fumarate-FA (MULTIVITAMIN-PRENATAL) 27-0.8 MG TABS tablet Take 1 tablet by mouth daily at 12 noon.   Taking   No results found for this or any previous visit (from the past 48 hour(s)).  Review of Systems  Eyes: Negative for photophobia and visual disturbance.  Gastrointestinal: Negative for abdominal pain.  Neurological: Negative for headaches.   Physical Exam   Blood pressure (!) 151/97, pulse (!) 104, temperature 98.7 F (37.1 C), temperature source Oral, resp. rate 17, height  (1.626 m), weight 265 lb (120.2 kg), last menstrual period 09/27/2016, SpO2 98 %.   Patient Vitals for the past 24 hrs:  BP Temp Temp src Pulse Resp SpO2 Height Weight  06/29/17 1801 - - - (!) 101 - - - -  06/29/17 1800 129/77 - - - - - - -  06/29/17 1758 121/84 - - 99 - - - -  06/29/17 1732 (!) 133/94 - - (!) 116 - - - -  06/29/17 1716 Marland Kitchen)  151/97 98.7 F (37.1 C) Oral (!) 104 17 98 %  (1.626 m) 265 lb (120.2 kg)   Physical Exam  Constitutional: She is oriented to person, place, and time. She appears well-developed and well-nourished. No distress.  HENT:  Head: Normocephalic.  Eyes: Pupils are equal, round, and reactive to light.  Respiratory: Effort normal.  GI: Soft. She exhibits no distension. There is no rebound.  Musculoskeletal: Normal range of motion.  Neurological: She is alert and oriented to person, place, and time. She displays normal reflexes.  Negative clonus   Skin: Skin is warm. She is not diaphoretic.  Psychiatric: Her behavior is normal.   Fetal Tracing: Baseline: 140 bpm Variability: Moderate  Accelerations: 15x15 Decelerations: None Toco: None   MAU Course  Procedures  None  MDM  BP on 4/30: 124/97 PCR, CBC and CMP pending Discussed BP readings with Dr. Earlene Plater, patient meets criteria for gestational HTN.  Assessment and Plan   A:  1. Gestational hypertension, third  trimester   2.       GBS +  P:  Admit to birthing suits PCN   Gerell Fortson, Harolyn Rutherford, NP 06/29/2017 6:17 PM

## 2017-06-30 ENCOUNTER — Inpatient Hospital Stay (HOSPITAL_COMMUNITY): Payer: Medicaid Other | Admitting: Anesthesiology

## 2017-06-30 LAB — CBC
HEMATOCRIT: 33.4 % — AB (ref 36.0–46.0)
Hemoglobin: 11 g/dL — ABNORMAL LOW (ref 12.0–15.0)
MCH: 25.9 pg — AB (ref 26.0–34.0)
MCHC: 32.9 g/dL (ref 30.0–36.0)
MCV: 78.8 fL (ref 78.0–100.0)
Platelets: 269 10*3/uL (ref 150–400)
RBC: 4.24 MIL/uL (ref 3.87–5.11)
RDW: 14.4 % (ref 11.5–15.5)
WBC: 18.7 10*3/uL — ABNORMAL HIGH (ref 4.0–10.5)

## 2017-06-30 LAB — RPR: RPR Ser Ql: NONREACTIVE

## 2017-06-30 MED ORDER — PHENYLEPHRINE 40 MCG/ML (10ML) SYRINGE FOR IV PUSH (FOR BLOOD PRESSURE SUPPORT)
80.0000 ug | PREFILLED_SYRINGE | INTRAVENOUS | Status: DC | PRN
  Filled 2017-06-30: qty 5

## 2017-06-30 MED ORDER — LABETALOL HCL 5 MG/ML IV SOLN: 40.0000 mg | Freq: Once | INTRAVENOUS | Status: DC | PRN

## 2017-06-30 MED ORDER — FENTANYL 2.5 MCG/ML BUPIVACAINE 1/10 % EPIDURAL INFUSION (WH - ANES)
14.0000 mL/h | INTRAMUSCULAR | Status: DC | PRN
  Administered 2017-06-30 – 2017-07-01 (×2): 14 mL/h via EPIDURAL
  Filled 2017-06-30 (×2): qty 100

## 2017-06-30 MED ORDER — DIPHENHYDRAMINE HCL 50 MG/ML IJ SOLN
12.5000 mg | INTRAMUSCULAR | Status: DC | PRN
  Administered 2017-07-01 (×2): 12.5 mg via INTRAVENOUS
  Filled 2017-06-30 (×2): qty 1

## 2017-06-30 MED ORDER — PHENYLEPHRINE 40 MCG/ML (10ML) SYRINGE FOR IV PUSH (FOR BLOOD PRESSURE SUPPORT)
80.0000 ug | PREFILLED_SYRINGE | INTRAVENOUS | Status: DC | PRN
  Filled 2017-06-30: qty 10
  Filled 2017-06-30: qty 5

## 2017-06-30 MED ORDER — LIDOCAINE HCL (PF) 1 % IJ SOLN
INTRAMUSCULAR | Status: DC | PRN
  Administered 2017-06-30: 13 mL via EPIDURAL

## 2017-06-30 MED ORDER — FENTANYL CITRATE (PF) 100 MCG/2ML IJ SOLN
100.0000 ug | INTRAMUSCULAR | Status: DC | PRN
Start: 2017-06-30 — End: 2017-07-01
  Administered 2017-06-30 (×10): 100 ug via INTRAVENOUS
  Filled 2017-06-30 (×10): qty 2

## 2017-06-30 MED ORDER — NIFEDIPINE 10 MG PO CAPS
10.0000 mg | ORAL_CAPSULE | ORAL | Status: AC | PRN
  Administered 2017-06-30 – 2017-07-01 (×3): 10 mg via ORAL
  Filled 2017-06-30 (×3): qty 1

## 2017-06-30 MED ORDER — EPHEDRINE 5 MG/ML INJ
10.0000 mg | INTRAVENOUS | Status: DC | PRN
  Filled 2017-06-30: qty 2

## 2017-06-30 MED ORDER — LACTATED RINGERS IV SOLN: 500.0000 mL | Freq: Once | INTRAVENOUS | Status: DC

## 2017-06-30 MED ORDER — MISOPROSTOL 25 MCG QUARTER TABLET
25.0000 ug | ORAL_TABLET | ORAL | Status: DC
  Administered 2017-06-30: 25 ug via VAGINAL
  Filled 2017-06-30 (×2): qty 1

## 2017-06-30 MED ORDER — OXYTOCIN 40 UNITS IN LACTATED RINGERS INFUSION - SIMPLE MED
1.0000 m[IU]/min | INTRAVENOUS | Status: DC
  Administered 2017-07-01: 2 m[IU]/min via INTRAVENOUS
  Administered 2017-07-01: 4 m[IU]/min via INTRAVENOUS
  Filled 2017-06-30: qty 1000

## 2017-06-30 MED ORDER — TERBUTALINE SULFATE 1 MG/ML IJ SOLN
0.2500 mg | Freq: Once | INTRAMUSCULAR | Status: DC | PRN
  Filled 2017-06-30: qty 1

## 2017-06-30 NOTE — Progress Notes (Signed)
Patient ID: Brandi Norris, female   DOB: 1995/05/20, 22 y.o.   MRN: [id number] Speaking to FOB on phone, loudly screaming at times per RN  Vitals:   06/29/17 2358 06/30/17 0112 06/30/17 0121 06/30/17 0135  BP: (!) 147/82 (!) 161/94 (!) 161/103 (!) 148/92  Pulse: 72 71 92 93  Resp: 18 20    Temp:  98.9 F (37.2 C)    TempSrc:  Axillary    SpO2:      Weight:      Height:       FHR reactive UCs irregular, not tracing well  Orders placed for preeclampsia focused order set, but after two elevated BPs (severe systolic), BP reverted to more reasonable level So Med held  Procardia chosen as med to use if returns to severe range  Continue Cytotec

## 2017-06-30 NOTE — Anesthesia Procedure Notes (Signed)
Epidural Patient location during procedure: OB Start time: 06/30/2017 10:28 PM End time: 06/30/2017 10:57 PM  Staffing Anesthesiologist: Lowella Curb, MD Performed: anesthesiologist   Preanesthetic Checklist Completed: patient identified, site marked, surgical consent, pre-op evaluation, timeout performed, IV checked, risks and benefits discussed and monitors and equipment checked  Epidural Patient position: sitting Prep: ChloraPrep Patient monitoring: heart rate, cardiac monitor, continuous pulse ox and blood pressure Approach: midline Location: L2-L3 Injection technique: LOR saline  Needle:  Needle type: Tuohy  Needle gauge: 17 G Needle length: 9 cm Needle insertion depth: 6 cm Catheter type: closed end flexible Catheter size: 20 Guage Catheter at skin depth: 11 cm Test dose: negative  Assessment Events: blood not aspirated, injection not painful, no injection resistance, negative IV test and no paresthesia  Additional Notes Reason for block:procedure for pain

## 2017-06-30 NOTE — Progress Notes (Signed)
Vitals:   06/30/17 1008 06/30/17 1120 06/30/17 1124 06/30/17 1316  BP: (!) 151/71  (!) 143/83 (!) 154/86  Pulse: 80  78 72  Resp:    18  Temp:  98 F (36.7 C)    TempSrc:  Oral    SpO2:      Weight:      Height:       FHR: 135/ moderate variability/ +accels/ no decels  Toco: 2-3/ moderate by palpation  Dilation: 6 Effacement (%): 80 Station: -3 Presentation: Vertex Exam by:: Aundria Rud, CNM  Tracing Cat I  Labor: Progressing well, plans to get into the water pool around 1600  Plans SVD   Sharyon Cable, CNM 06/30/17, 3:21 PM

## 2017-06-30 NOTE — Progress Notes (Signed)
Patient ID: Brandi Norris, female   DOB: 05-13-1995, 22 y.o.   MRN: [id number]  Category 1 tracing. Having some cramping with cytotec.   Dilation: 2 Effacement (%): 70 Station: -3 Presentation: Vertex Exam by:: Dr. Adrian Blackwater  Balloon placed.  Cytotec placed.  Expectant management  Levie Heritage, DO 06/30/2017 9:50 AM

## 2017-06-30 NOTE — Progress Notes (Signed)
Patient ID: Brandi Norris, female   DOB: 1995-03-02, 22 y.o.   MRN: [id number] Vitals:   06/30/17 0300 06/30/17 0341 06/30/17 0400 06/30/17 0431  BP: (!) 161/85 (!) 145/83 138/80 (!) 149/86  Pulse: 75 81 81 92  Resp: Temp:      TempSrc:      SpO2:      Weight:      Height:       FHR stable UCs irregular  Dilation: 1 Effacement (%): Thick Station: -3 Presentation: Vertex Exam by:: Gwendolyn Grant, RN   Will see if we can insert Foley at next check

## 2017-06-30 NOTE — Anesthesia Preprocedure Evaluation (Signed)
Anesthesia Evaluation  Patient identified by MRN, date of birth, ID band Patient awake    Reviewed: Allergy & Precautions, NPO status , Patient's Chart, lab work & pertinent test results  Airway Mallampati: II  TM Distance: >3 FB Neck ROM: Full    Dental no notable dental hx.    Pulmonary neg pulmonary ROS,    Pulmonary exam normal breath sounds clear to auscultation       Cardiovascular hypertension, negative cardio ROS Normal cardiovascular exam Rhythm:Regular Rate:Normal     Neuro/Psych negative neurological ROS  negative psych ROS   GI/Hepatic negative GI ROS, Neg liver ROS,   Endo/Other  negative endocrine ROSMorbid obesity  Renal/GU negative Renal ROS  negative genitourinary   Musculoskeletal negative musculoskeletal ROS (+)   Abdominal   Peds negative pediatric ROS (+)  Hematology negative hematology ROS (+)   Anesthesia Other Findings   Reproductive/Obstetrics negative OB ROS (+) Pregnancy                             Anesthesia Physical Anesthesia Plan  ASA: III  Anesthesia Plan: Epidural   Post-op Pain Management:    Induction:   PONV Risk Score and Plan:   Airway Management Planned:   Additional Equipment:   Intra-op Plan:   Post-operative Plan:   Informed Consent:   Plan Discussed with:   Anesthesia Plan Comments:         Anesthesia Quick Evaluation

## 2017-06-30 NOTE — Progress Notes (Signed)
Patient ID: Brandi Norris, female   DOB: [birth year], 22 y.o.   MRN: [id number] Resting on side, c/o constant back pain  Vitals:   06/30/17 1820 06/30/17 1900 06/30/17 1955 06/30/17 2000  BP: 137/62  (!) 156/78   Pulse: (!) 104  87   Resp:   16   Temp:  98.4 F (36.9 C)  98 F (36.7 C)  TempSrc:    Axillary  SpO2:      Weight:      Height:       FHR stable with early/variable decels with many of her contractions UCs every 4 minutes  Discussed options for care Reviewed risks and benefits of Pitocin Augmentation, Epidural analgesia, IUPC.  Refuses IUPC Accepts Pitocin augmentation Was interested in epidural but her mother is against the idea of "needle in your back" I told pt it is her decision.  Will likely proceed.

## 2017-07-01 ENCOUNTER — Encounter (HOSPITAL_COMMUNITY): Payer: Self-pay | Admitting: *Deleted

## 2017-07-01 DIAGNOSIS — O134 Gestational [pregnancy-induced] hypertension without significant proteinuria, complicating childbirth: Secondary | ICD-10-CM

## 2017-07-01 DIAGNOSIS — Z3A39 39 weeks gestation of pregnancy: Secondary | ICD-10-CM

## 2017-07-01 DIAGNOSIS — O99824 Streptococcus B carrier state complicating childbirth: Secondary | ICD-10-CM

## 2017-07-01 MED ORDER — SIMETHICONE 80 MG PO CHEW: 80.0000 mg | CHEWABLE_TABLET | ORAL | Status: DC | PRN

## 2017-07-01 MED ORDER — PRENATAL MULTIVITAMIN CH
1.0000 | ORAL_TABLET | Freq: Every day | ORAL | Status: DC
  Administered 2017-07-02 – 2017-07-03 (×2): 1 via ORAL
  Filled 2017-07-01 (×2): qty 1

## 2017-07-01 MED ORDER — ONDANSETRON HCL 4 MG/2ML IJ SOLN: 4.0000 mg | INTRAMUSCULAR | Status: DC | PRN

## 2017-07-01 MED ORDER — ZOLPIDEM TARTRATE 5 MG PO TABS: 5.0000 mg | ORAL_TABLET | Freq: Every evening | ORAL | Status: DC | PRN

## 2017-07-01 MED ORDER — COCONUT OIL OIL
1.0000 "application " | TOPICAL_OIL | Status: DC | PRN
  Filled 2017-07-01: qty 120

## 2017-07-01 MED ORDER — ONDANSETRON HCL 4 MG PO TABS
4.0000 mg | ORAL_TABLET | ORAL | Status: DC | PRN
Start: 2017-07-01 — End: 2017-07-03

## 2017-07-01 MED ORDER — IBUPROFEN 600 MG PO TABS
600.0000 mg | ORAL_TABLET | Freq: Four times a day (QID) | ORAL | Status: DC
  Administered 2017-07-01 – 2017-07-03 (×9): 600 mg via ORAL
  Filled 2017-07-01 (×9): qty 1

## 2017-07-01 MED ORDER — SENNOSIDES-DOCUSATE SODIUM 8.6-50 MG PO TABS
2.0000 | ORAL_TABLET | ORAL | Status: DC
  Administered 2017-07-02 (×2): 2 via ORAL
  Filled 2017-07-01 (×2): qty 2

## 2017-07-01 MED ORDER — ACETAMINOPHEN 325 MG PO TABS
650.0000 mg | ORAL_TABLET | ORAL | Status: DC | PRN
  Administered 2017-07-01: 650 mg via ORAL
  Filled 2017-07-01: qty 2

## 2017-07-01 MED ORDER — BENZOCAINE-MENTHOL 20-0.5 % EX AERO
1.0000 | INHALATION_SPRAY | CUTANEOUS | Status: DC | PRN
Start: 2017-07-01 — End: 2017-07-03
  Administered 2017-07-01: 1 via TOPICAL
  Filled 2017-07-01: qty 56

## 2017-07-01 MED ORDER — TETANUS-DIPHTH-ACELL PERTUSSIS 5-2.5-18.5 LF-MCG/0.5 IM SUSP: 0.5000 mL | Freq: Once | INTRAMUSCULAR | Status: DC

## 2017-07-01 MED ORDER — WITCH HAZEL-GLYCERIN EX PADS: 1.0000 "application " | MEDICATED_PAD | CUTANEOUS | Status: DC | PRN

## 2017-07-01 MED ORDER — DIBUCAINE 1 % RE OINT: 1.0000 "application " | TOPICAL_OINTMENT | RECTAL | Status: DC | PRN

## 2017-07-01 MED ORDER — DIPHENHYDRAMINE HCL 25 MG PO CAPS: 25.0000 mg | ORAL_CAPSULE | Freq: Four times a day (QID) | ORAL | Status: DC | PRN

## 2017-07-01 NOTE — Progress Notes (Signed)
Dr. Doroteo Glassman aware of high BP of 149/94. Parameters set.

## 2017-07-01 NOTE — Anesthesia Postprocedure Evaluation (Signed)
Anesthesia Post Note  Patient: Brandi Norris  Procedure(s) Performed: AN AD HOC LABOR EPIDURAL     Patient location during evaluation: Mother Baby Anesthesia Type: Epidural Level of consciousness: awake and alert and oriented Pain management: satisfactory to patient Vital Signs Assessment: post-procedure vital signs reviewed and stable Respiratory status: spontaneous breathing and nonlabored ventilation Cardiovascular status: stable Postop Assessment: no headache, no backache, no signs of nausea or vomiting, adequate PO intake, patient able to bend at knees and able to ambulate (patient up walking) Anesthetic complications: no    Last Vitals:  Vitals:   07/01/17 0853 07/01/17 0928  BP: (!) 141/95 (!) 149/94  Pulse: 85 84  Resp: 18   Temp: 36.8 C   SpO2: 99%     Last Pain:  Vitals:   07/01/17 0544  TempSrc:   PainSc: 0-No pain   Pain Goal:                 Madison Hickman

## 2017-07-01 NOTE — Lactation Note (Signed)
This note was copied from a baby's chart. Lactation Consultation Note  Patient Name: Boy Tariya JohnsRie McneilDate: 07/01/2017 Reason for consult: Primapara;1st time breastfeeding;Follow-up assessment  24 hours old female who is being exclusively BF by his mother, she's a P1. RN called for latch assistance but when LC got into the room baby was already falling asleep, he fell asleep at the breast while mom was trying to feed him. Noticed that baby was still very spitty/juicy, mom was burping him and had him swaddled; he was also wearing clothes. Explained to mom the benefits of STS and recommended undressing baby down to his diaper and hat to encourage nutritive feedings at the breast.  Mom is very cooperative, reviewed hand expression, and manual pump, she got very excited when seeing some droplets of colostrum coming out, LC fed those to baby with finger feeding, and also did some suck training. Baby sucking pattern is still uncoordinated with some biting.  Encouraged mom to feed STS 8-12 times/24 hours or sooner if feeding cues are present. She'll continue working on latching and hand expressing and call for latch assistance when needed.  Maternal Data Has patient been taught Hand Expression?: Yes(noted a tiny drop ) Does the patient have breastfeeding experience prior to this delivery?: No  Feeding Feeding Type: Breast Fed Length of feed: (attempted, flanged lips, no latch )  LATCH Score Latch: Too sleepy or reluctant, no latch achieved, no sucking elicited.(baby placed STS and reasured mom )  Audible Swallowing: None  Type of Nipple: Everted at rest and after stimulation  Comfort (Breast/Nipple): Soft / non-tender  Hold (Positioning): Assistance needed to correctly position infant at breast and maintain latch.  LATCH Score: 5  Interventions Interventions: Breast feeding basics reviewed;Assisted with latch;Skin to skin;Breast massage;Hand express;Breast compression;Expressed  milk;Support pillows;Adjust position;Hand pump  Lactation Tools Discussed/Used Tools: Pump(MBURN instructed mom on the use hand pump ) Breast pump type: Manual WIC Program: Yes Pump Review: (set up by the RN )   Consult Status Consult Status: Follow-up Date: 07/02/17 Follow-up type: In-patient    Darianne Muralles Venetia Constable 07/01/2017, 6:09 PM

## 2017-07-01 NOTE — Lactation Note (Signed)
This note was copied from a baby's chart. Lactation Consultation Note  Patient Name: Brandi Norris [id number] Date: 07/01/2017 Reason for consult: Initial assessment;1st time breastfeeding;Primapara;Term  Baby is 10 hours old LC reviewed and updated the doc flow per mom As  LC entered the room , baby wrapped in blankets on moms chest and mom mentioned  She had a fast delivery and the baby has been spitty and not interested in feeding.  LC offered to assist with showing mom how to hand express with tiny drops of colostrum  Noted. Baby acting like he wanted to spit, gaggy, small clear secretions noted, and bulb x 1.  LC changed a large dark black to meconium stool and wet diaper.  Baby acting like he might want to feed , attempted again, no latch , placed STS on moms chest.  Mom receptive to teaching.  Mother informed of post-discharge support and given phone number to the lactation department, including services for phone call assistance; out-patient appointments; and breastfeeding support group. List of other breastfeeding resources in the community given in the handout. Encouraged mother to call for problems or concerns related to breastfeeding.   Maternal Data Has patient been taught Hand Expression?: Yes(noted a tiny drop ) Does the patient have breastfeeding experience prior to this delivery?: No  Feeding Feeding Type: Breast Fed Length of feed: (attempted, flanged lips, no latch )  LATCH Score Latch: Too sleepy or reluctant, no latch achieved, no sucking elicited.(baby placed STS and reasured mom )  Audible Swallowing: None  Type of Nipple: Everted at rest and after stimulation  Comfort (Breast/Nipple): Soft / non-tender  Hold (Positioning): Assistance needed to correctly position infant at breast and maintain latch.  LATCH Score: 5  Interventions Interventions: Breast feeding basics reviewed  Lactation Tools Discussed/Used Tools: Pump(MBURN instructed mom on the use  hand pump ) Breast pump type: Manual WIC Program: Yes Pump Review: (set up by the RN )   Consult Status Consult Status: Follow-up Date: 07/01/17 Follow-up type: In-patient    Matilde Sprang Andrewjames Weirauch 07/01/2017, 4:44 PM

## 2017-07-01 NOTE — Progress Notes (Signed)
Dr. Doroteo Glassman called to be notified of BP of 149/94 but she stated she would call me back. Waiting for call. Patient states she feels fine and has no blurred vision or is no seeing spots.

## 2017-07-02 NOTE — Lactation Note (Signed)
Mom called to desk to see if she could get some water for the baby, she was concerned that he was dehydrated.  Education given about not feeding baby any water.  Mom was concerned that because baby was really spitty and not very interested in eating.  I explained normal newborn behavior & demonstrated hand expression.  There was an easy flow of colostrum and Mom was able to demonstrate back with a flow of colostrum.  I suggested skin to skin for 15-20 mins if baby was not interested in latching.  Mom states her understanding.

## 2017-07-02 NOTE — Lactation Note (Signed)
This note was copied from a baby's chart. Lactation Consultation Note  Patient Name: Brandi Norris [id number] Date: 07/02/2017 Reason for consult: Follow-up assessment  Baby  Is 36 hours old  LC reviewed and updated the doc flow sheets    Maternal Data Has patient been taught Hand Expression?: Yes  Feeding Feeding Type: Breast Fed Length of feed: (swallows noted )  LATCH Score Latch: Grasps breast easily, tongue down, lips flanged, rhythmical sucking.  Audible Swallowing: Spontaneous and intermittent  Type of Nipple: Everted at rest and after stimulation  Comfort (Breast/Nipple): Soft / non-tender  Hold (Positioning): Assistance needed to correctly position infant at breast and maintain latch.  LATCH Score: 9  Interventions Interventions: Breast feeding basics reviewed;Assisted with latch;Skin to skin;Breast massage;Breast compression;Adjust position;Support pillows;Reverse pressure;Position options  Lactation Tools Discussed/Used     Consult Status Consult Status: Follow-up Date: 07/03/17 Follow-up type: In-patient    Brandi Norris 07/02/2017, 6:27 PM

## 2017-07-02 NOTE — Progress Notes (Signed)
Post Partum Day #1 Subjective: no complaints, up ad lib, voiding and tolerating PO  Objective: Blood pressure (!) 122/94, pulse 82, temperature 97.7 F (36.5 C), temperature source Oral, resp. rate 18, height  (1.626 m), weight 120.2 kg (265 lb), last menstrual period 09/27/2016, SpO2 99 %, unknown if currently breastfeeding.  Physical Exam:  General: alert, cooperative and no distress Lochia: appropriate Uterine Fundus: firm Incision: n/a DVT Evaluation: No evidence of DVT seen on physical exam.  Recent Labs    06/29/17 1810 06/30/17 1919  HGB 10.9* 11.0*  HCT 33.0* 33.4*    Assessment/Plan: Plan for discharge tomorrow and Breastfeeding   LOS: 3 days   Felisa Bonier, MD, PGY-1 Family Medicine- [facility] 07/02/2017, 8:42 AM

## 2017-07-03 ENCOUNTER — Encounter: Payer: Medicaid Other | Admitting: Family Medicine

## 2017-07-03 MED ORDER — DIBUCAINE 1 % RE OINT: 1.0000 "application " | TOPICAL_OINTMENT | RECTAL | 0 refills | Status: DC | PRN

## 2017-07-03 MED ORDER — IBUPROFEN 600 MG PO TABS: 600.0000 mg | ORAL_TABLET | Freq: Four times a day (QID) | ORAL | 0 refills | Status: DC

## 2017-07-03 MED ORDER — WITCH HAZEL-GLYCERIN EX PADS: 1.0000 "application " | MEDICATED_PAD | CUTANEOUS | 12 refills | Status: DC | PRN

## 2017-07-03 MED ORDER — DOCUSATE SODIUM 100 MG PO CAPS: 100.0000 mg | ORAL_CAPSULE | Freq: Every day | ORAL | 2 refills | Status: AC | PRN

## 2017-07-03 MED ORDER — COCONUT OIL OIL: 1.0000 "application " | TOPICAL_OIL | 0 refills | Status: DC | PRN

## 2017-07-03 MED ORDER — BENZOCAINE-MENTHOL 20-0.5 % EX AERO: 1.0000 "application " | INHALATION_SPRAY | CUTANEOUS | Status: DC | PRN

## 2017-07-03 NOTE — Lactation Note (Signed)
This note was copied from a baby's chart. Lactation Consultation Note  Patient Name: Brandi Norris [id number] Date: 07/03/2017    [facility] Follow Up Visit:  Mother asking for assistance; per mom, baby keeps crying and she cannot console him.  Upon entering the room baby was crying frantically in the bassinet.  Mother wanted to change diaper and I offered to help latch infant.    Mother needed reassurance and practice with breast massage and hand expression.  I provided suggestions and advice.  She was able to express a few drops of colostrum.  Attempted to latch in the football hold on the left breast without success.  I suggested the cross cradle and mother agreed to try.  After positioning her appropriately I helped latch in the cross cradle hold on the left breast.  Reminded mother how to obtain a deep latch and to not force her nipple into his mouth.  Also reminded her to keep her fingers back away from the areola so baby could obtain a good deep latch.  After a few attempts he was able to latch and sustain good rhythmic sucks.  His lips were flanged well and audible swallows were noted.  Mother stated that she needs to be patient.  I praised her efforts and suggested less bottle and more breast.    Encouraged her to do STS, feed 8-12 times/24 hours or more if he shows cues, breast massage and hand expression and to pre pump with the manual pump to help evert the nipples which seemed to help.    Mother will continue feeding until he releases and then attempt the right breast.  She will call for assistance as needed.                  Tamelia Michalowski R Pedro Oldenburg 07/03/2017, 3:00 AM
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
## 2017-07-03 NOTE — Progress Notes (Signed)
CSW acknowledges consult.  CSW attempted to meet with MOB, however MOB and infant were gone to ultrasound department for medical interventions for infant. CSW will attempt to visit with MOB prior to family discharging today.   Eyva Califano Boyd-Gilyard, MSW, LCSW Clinical Social Work [phone]  

## 2017-07-03 NOTE — Discharge Summary (Addendum)
OB Discharge Summary     Patient Name: Brandi Norris DOB: Jun 16, 1995 MRN: [id number]  Date of admission: 06/29/2017 Delivering MD: Seabron Spates   Date of discharge: 07/03/2017  Admitting diagnosis: PREG 39 wks high blood pressure Intrauterine pregnancy: [redacted]w[redacted]d    Secondary diagnosis:  Active Problems:   Gestational hypertension  Additional problems: GBS positive, s/p PCN x7     Discharge diagnosis: Gestational Hypertension                                                                                                Post partum procedures:None  Augmentation: Pitocin and Cytotec  Complications: None  Hospital course:  Induction of Labor With Vaginal Delivery   22y.o. yo [id number] admitted to the hospital 06/29/2017 for induction of labor.  Indication for induction: Gestational hypertension.  Patient had an uncomplicated labor course as follows: Membrane Rupture Time/Date: 2:38 AM ,06/30/2017   Intrapartum Procedures: Episiotomy: None [1]                                         Lacerations:  1st degree [2];Perineal [11]  Patient had delivery of a Viable infant.  Information for the patient's newborn:  [name], Reindl0[[id number]]Delivery Method: Vaginal, Spontaneous(Filed from Delivery Summary)   07/01/2017  Details of delivery can be found in separate delivery note.  Patient had a routine postpartum course. Patient is discharged home 07/03/17.  Physical exam  Vitals:   07/02/17 1529 07/02/17 1848 07/02/17 1958 07/03/17 0539  BP: 127/84 (!) 140/93 122/82 (!) 147/84  Pulse: 73 78 85 65  Resp: '20 19  20  '$ Temp:  98.4 F (36.9 C)  98.2 F (36.8 C)  TempSrc:  Oral  Oral  SpO2: 100%     Weight:      Height:       General: alert Lochia: appropriate Uterine Fundus: firm DVT Evaluation: No evidence of DVT seen on physical exam. Labs: Lab Results  Component Value Date   WBC 18.7 (H) 06/30/2017   HGB 11.0 (L) 06/30/2017   HCT 33.4 (L) 06/30/2017    MCV 78.8 06/30/2017   PLT 269 06/30/2017   CMP Latest Ref Rng & Units 06/29/2017  Glucose 65 - 99 mg/dL 89  BUN 6 - 20 mg/dL 10  Creatinine 0.44 - 1.00 mg/dL 0.90  Sodium 135 - 145 mmol/L 135  Potassium 3.5 - 5.1 mmol/L 4.0  Chloride 101 - 111 mmol/L 105  CO2 22 - 32 mmol/L 20(L)  Calcium 8.9 - 10.3 mg/dL 9.2  Total Protein 6.5 - 8.1 g/dL 6.6  Total Bilirubin 0.3 - 1.2 mg/dL 0.3  Alkaline Phos 38 - 126 U/L 115  AST 15 - 41 U/L 19  ALT 14 - 54 U/L 10(L)    Discharge instruction: per After Visit Summary and "Baby and Me Booklet".  After visit meds:  Allergies as of 07/03/2017   No Known Allergies     Medication List  STOP taking these medications   acetaminophen 325 MG tablet Commonly known as:  TYLENOL     TAKE these medications   benzocaine-Menthol 20-0.5 % Aero Commonly known as:  DERMOPLAST Apply 1 application topically as needed for irritation (perineal discomfort).   coconut oil Oil Apply 1 application topically as needed.   COMFORT FIT MATERNITY SUPP MED Misc Wear daily when ambulating   dibucaine 1 % Oint Commonly known as:  NUPERCAINAL Place 1 application rectally as needed for hemorrhoids.   docusate sodium 100 MG capsule Commonly known as:  COLACE Take 1 capsule (100 mg total) by mouth daily as needed.   hydrOXYzine 25 MG tablet Commonly known as:  ATARAX/VISTARIL Take 1 tablet (25 mg total) by mouth every 6 (six) hours as needed for itching.   ibuprofen 600 MG tablet Commonly known as:  ADVIL,MOTRIN Take 1 tablet (600 mg total) by mouth every 6 (six) hours.   multivitamin-prenatal 27-0.8 MG Tabs tablet Take 1 tablet by mouth daily at 12 noon.   witch hazel-glycerin pad Commonly known as:  TUCKS Apply 1 application topically as needed for hemorrhoids.       Diet: routine diet  Activity: Advance as tolerated. Pelvic rest for 6 weeks.   Outpatient follow up:3 day for BP check, Baby Love order placed for close follow up Follow up  Appt: Future Appointments  Date Time Provider [place]  07/10/2017 10:00 AM Seabron Spates, CNM [facility] None   Follow up Visit:No follow-ups on file.  Postpartum contraception: Patient unsure  Newborn Data: Live born female  Birth Weight: 7 lb 3.3 oz (3270 g) APGAR: 62, 9  Newborn Delivery   Birth date/time:  07/01/2017 06:10:00 Delivery type:  Vaginal, Spontaneous     Baby Feeding: Breast Disposition:home with mother   07/03/2017 Bonnita Hollow, MD  I have spoken with and examined this patient and agree with resident/PA-S/Med-S/SNM's note and plan of care. VSS, HRR&R, Resp unlabored, Legs neg.  Nigel Berthold, CNM 07/07/2017 9:12 AM

## 2017-07-03 NOTE — Lactation Note (Signed)
This note was copied from a baby's chart. Lactation Consultation Note:  Arrived in Mothers room for follow up assistance. Mother reports that she latched infant on for 5 mins this am and was able to latch infant during the night several times. Mother reports that she just fed infant a bottle and he took 52 ml.  Recommend that mother begin regular pumping on schedule if she plans to make a good milk supply. Assist mother with pumping with hand pump to check flange size. Mother used a # 24 flange. She began pumping and sprays of milk began flowing. Mother very excited to see she was really making milk .  Assist mother to sit up in chair with good pillow support. Mother was taught football hold. She was taught hand expression and milk flowed from her nipple. Again mother in disbelief that she was making milk. Infant not cueing or showing any signs of being hungry. Mother advised to place infant skin to skin frequently. Suggested that she use the DEBP for 15 mins to provide ebm to infant in a bottle if unable to latch infant. Mother advised to follow with [facility] or staff nurse for assistance with latching infant. Discussed treatment and prevention of engorgement. Mothers breast are filling heavy she reports. Lots of support and encouragement given to mother . Advised to continue to do frequent skin to skin and feed infant with feeding cues and at least 8-12 times in 24hours. Mother is aware of available LC services and she is active with WIC. Mother receptive to all teaching.   Patient Name: Boy Shacoria Latif [id number] Date: 07/03/2017 Reason for consult: Follow-up assessment   Maternal Data    Feeding Feeding Type: Breast Fed Length of feed: (no latch achieved)  LATCH Score                   Interventions    Lactation Tools Discussed/Used     Consult Status Consult Status: Complete    Michel Bickers 07/03/2017, 1:41 PM

## 2017-07-03 NOTE — Discharge Instructions (Signed)
Vaginal Delivery, Care After °Refer to this sheet in the next few weeks. These instructions provide you with information about caring for yourself after vaginal delivery. Your health care provider may also give you more specific instructions. Your treatment has been planned according to current medical practices, but problems sometimes occur. Call your health care provider if you have any problems or questions. °What can I expect after the procedure? °After vaginal delivery, it is common to have: °· Some bleeding from your vagina. °· Soreness in your abdomen, your vagina, and the area of skin between your vaginal opening and your anus (perineum). °· Pelvic cramps. °· Fatigue. ° °Follow these instructions at home: °Medicines °· Take over-the-counter and prescription medicines only as told by your health care provider. °· If you were prescribed an antibiotic medicine, take it as told by your health care provider. Do not stop taking the antibiotic until it is finished. °Driving ° °· Do not drive or operate heavy machinery while taking prescription pain medicine. °· Do not drive for 24 hours if you received a sedative. °Lifestyle °· Do not drink alcohol. This is especially important if you are breastfeeding or taking medicine to relieve pain. °· Do not use tobacco products, including cigarettes, chewing tobacco, or e-cigarettes. If you need help quitting, ask your health care provider. °Eating and drinking °· Drink at least 8 eight-ounce glasses of water every day unless you are told not to by your health care provider. If you choose to breastfeed your baby, you may need to drink more water than this. °· Eat high-fiber foods every day. These foods may help prevent or relieve constipation. High-fiber foods include: °? Whole grain cereals and breads. °? Brown rice. °? Beans. °? Fresh fruits and vegetables. °Activity °· Return to your normal activities as told by your health care provider. Ask your health care provider  what activities are safe for you. °· Rest as much as possible. Try to rest or take a nap when your baby is sleeping. °· Do not lift anything that is heavier than your baby or 10 lb (4.5 kg) until your health care provider says that it is safe. °· Talk with your health care provider about when you can engage in sexual activity. This may depend on your: °? Risk of infection. °? Rate of healing. °? Comfort and desire to engage in sexual activity. °Vaginal Care °· If you have an episiotomy or a vaginal tear, check the area every day for signs of infection. Check for: °? More redness, swelling, or pain. °? More fluid or blood. °? Warmth. °? Pus or a bad smell. °· Do not use tampons or douches until your health care provider says this is safe. °· Watch for any blood clots that may pass from your vagina. These may look like clumps of dark red, brown, or black discharge. °General instructions °· Keep your perineum clean and dry as told by your health care provider. °· Wear loose, comfortable clothing. °· Wipe from front to back when you use the toilet. °· Ask your health care provider if you can shower or take a bath. If you had an episiotomy or a perineal tear during labor and delivery, your health care provider may tell you not to take baths for a certain length of time. °· Wear a bra that supports your breasts and fits you well. °· If possible, have someone help you with household activities and help care for your baby for at least a few days after   you leave the hospital. °· Keep all follow-up visits for you and your baby as told by your health care provider. This is important. °Contact a health care provider if: °· You have: °? Vaginal discharge that has a bad smell. °? Difficulty urinating. °? Pain when urinating. °? A sudden increase or decrease in the frequency of your bowel movements. °? More redness, swelling, or pain around your episiotomy or vaginal tear. °? More fluid or blood coming from your episiotomy or  vaginal tear. °? Pus or a bad smell coming from your episiotomy or vaginal tear. °? A fever. °? A rash. °? Little or no interest in activities you used to enjoy. °? Questions about caring for yourself or your baby. °· Your episiotomy or vaginal tear feels warm to the touch. °· Your episiotomy or vaginal tear is separating or does not appear to be healing. °· Your breasts are painful, hard, or turn red. °· You feel unusually sad or worried. °· You feel nauseous or you vomit. °· You pass large blood clots from your vagina. If you pass a blood clot from your vagina, save it to show to your health care provider. Do not flush blood clots down the toilet without having your health care provider look at them. °· You urinate more than usual. °· You are dizzy or light-headed. °· You have not breastfed at all and you have not had a menstrual period for 12 weeks after delivery. °· You have stopped breastfeeding and you have not had a menstrual period for 12 weeks after you stopped breastfeeding. °Get help right away if: °· You have: °? Pain that does not go away or does not get better with medicine. °? Chest pain. °? Difficulty breathing. °? Blurred vision or spots in your vision. °? Thoughts about hurting yourself or your baby. °· You develop pain in your abdomen or in one of your legs. °· You develop a severe headache. °· You faint. °· You bleed from your vagina so much that you fill two sanitary pads in one hour. °This information is not intended to replace advice given to you by your health care provider. Make sure you discuss any questions you have with your health care provider. °Document Released: 02/01/2000 Document Revised: 07/18/2015 Document Reviewed: 02/18/2015 °Elsevier Interactive Patient Education © 2018 [employer] ° °

## 2017-07-04 ENCOUNTER — Encounter: Payer: Self-pay | Admitting: Family Medicine

## 2017-07-10 ENCOUNTER — Encounter (HOSPITAL_COMMUNITY): Payer: Self-pay | Admitting: *Deleted

## 2017-07-10 ENCOUNTER — Encounter: Payer: Self-pay | Admitting: Advanced Practice Midwife

## 2017-07-10 ENCOUNTER — Encounter (HOSPITAL_COMMUNITY): Payer: Self-pay | Admitting: Student

## 2017-07-10 ENCOUNTER — Ambulatory Visit (INDEPENDENT_AMBULATORY_CARE_PROVIDER_SITE_OTHER): Payer: Medicaid Other | Admitting: Advanced Practice Midwife

## 2017-07-10 ENCOUNTER — Inpatient Hospital Stay (HOSPITAL_COMMUNITY)
Admission: AD | Admit: 2017-07-10 | Discharge: 2017-07-10 | Disposition: A | Discharge status: Home / Self Care | Payer: Medicaid Other | Source: Ambulatory Visit | Attending: Obstetrics and Gynecology | Admitting: Obstetrics and Gynecology

## 2017-07-10 VITALS — BP 133/89 | HR 76

## 2017-07-10 DIAGNOSIS — O165 Unspecified maternal hypertension, complicating the puerperium: Secondary | ICD-10-CM

## 2017-07-10 DIAGNOSIS — R030 Elevated blood-pressure reading, without diagnosis of hypertension: Secondary | ICD-10-CM | POA: Diagnosis present

## 2017-07-10 HISTORY — DX: Gestational (pregnancy-induced) hypertension without significant proteinuria, unspecified trimester: O13.9

## 2017-07-10 LAB — COMPREHENSIVE METABOLIC PANEL
ALBUMIN: 3.7 g/dL (ref 3.5–5.0)
ALT: 17 U/L (ref 14–54)
AST: 18 U/L (ref 15–41)
Alkaline Phosphatase: 79 U/L (ref 38–126)
Anion gap: 11 (ref 5–15)
BUN: 7 mg/dL (ref 6–20)
CHLORIDE: 105 mmol/L (ref 101–111)
CO2: 22 mmol/L (ref 22–32)
CREATININE: 0.98 mg/dL (ref 0.44–1.00)
Calcium: 8.6 mg/dL — ABNORMAL LOW (ref 8.9–10.3)
GFR calc Af Amer: >60 mL/min (ref >60)
GLUCOSE: 82 mg/dL (ref 65–99)
POTASSIUM: 3.2 mmol/L — AB (ref 3.5–5.1)
SODIUM: 138 mmol/L (ref 135–145)
Total Bilirubin: 0.3 mg/dL (ref 0.3–1.2)
Total Protein: 7.7 g/dL (ref 6.5–8.1)

## 2017-07-10 LAB — CBC
HCT: 34.2 % — ABNORMAL LOW (ref 36.0–46.0)
Hemoglobin: 10.9 g/dL — ABNORMAL LOW (ref 12.0–15.0)
MCH: 25.5 pg — ABNORMAL LOW (ref 26.0–34.0)
MCHC: 31.9 g/dL (ref 30.0–36.0)
MCV: 80.1 fL (ref 78.0–100.0)
PLATELETS: 356 10*3/uL (ref 150–400)
RBC: 4.27 MIL/uL (ref 3.87–5.11)
RDW: 14.8 % (ref 11.5–15.5)
WBC: 8.7 10*3/uL (ref 4.0–10.5)

## 2017-07-10 LAB — PROTEIN / CREATININE RATIO, URINE
CREATININE, URINE: 268 mg/dL
Protein Creatinine Ratio: 0.04 mg/mg{Cre} (ref 0.00–0.15)
Total Protein, Urine: 11 mg/dL

## 2017-07-10 MED ORDER — NIFEDIPINE ER OSMOTIC RELEASE 30 MG PO TB24: 30.0000 mg | ORAL_TABLET | Freq: Two times a day (BID) | ORAL | 0 refills | Status: DC

## 2017-07-10 MED ORDER — HYDRALAZINE HCL 20 MG/ML IJ SOLN
10.0000 mg | Freq: Once | INTRAMUSCULAR | Status: AC
  Administered 2017-07-10: 10 mg via INTRAMUSCULAR
  Filled 2017-07-10: qty 1

## 2017-07-10 NOTE — MAU Provider Note (Signed)
History     CSN: [id number]  Arrival date and time: 07/10/17 1122   First Provider Initiated Contact with Patient 07/10/17 1218      Chief Complaint  Patient presents with  . Hypertension  . Headache   HPI Brandi Norris is a 22 y.o. G3P1021 at 9 days s/p SVD who presents from the office for BP evaluation. Pt had IOL for gestational hypertension. Denies any history of hypertension outside of pregnancy. Presented to our [facility] office today for a BP check. BP elevated so was sent here. Reports occasional headaches in the morning that resolve without intervention. Denies visual disturbance or epigastric pain. No headache now. She is breast and bottle feeding. Not on antihypertensives.   OB History    Gravida  3   Para  1   Term  1   Preterm      AB  2   Living  1     SAB  2   TAB      Ectopic      Multiple  0   Live Births  1           Past Medical History:  Diagnosis Date  . Miscarriage   . Pregnancy induced hypertension     Past Surgical History:  Procedure Laterality Date  . DILATION AND CURETTAGE OF UTERUS    . WISDOM TOOTH EXTRACTION      Family History  Problem Relation Age of Onset  . Stroke Mother   . Fibromyalgia Mother   . Neuropathy Mother   . Bursitis Mother   . Osteoarthritis Mother   . Aneurysm Father   . Brain cancer Maternal Aunt   . Throat cancer Maternal Uncle   . Breast cancer Paternal Aunt   . Diabetes Maternal Grandmother   . Hypertension Maternal Grandmother   . Heart disease Paternal Grandmother     Social History   Tobacco Use  . Smoking status: Never Smoker  . Smokeless tobacco: Never Used  Substance Use Topics  . Alcohol use: No  . Drug use: No    Allergies: No Known Allergies  Medications Prior to Admission  Medication Sig Dispense Refill Last Dose  . benzocaine-Menthol (DERMOPLAST) 20-0.5 % AERO Apply 1 application topically as needed for irritation (perineal discomfort).   Taking  . coconut oil OIL Apply 1  application topically as needed.  0 Taking  . dibucaine (NUPERCAINAL) 1 % OINT Place 1 application rectally as needed for hemorrhoids.  0 Taking  . docusate sodium (COLACE) 100 MG capsule Take 1 capsule (100 mg total) by mouth daily as needed. 30 capsule 2 Taking  . Elastic Bandages & Supports (COMFORT FIT MATERNITY SUPP MED) MISC Wear daily when ambulating (Patient not taking: Reported on 06/02/2017) 1 each 0 Not Taking  . hydrOXYzine (ATARAX/VISTARIL) 25 MG tablet Take 1 tablet (25 mg total) by mouth every 6 (six) hours as needed for itching. (Patient not taking: Reported on 06/30/2017) 30 tablet 2 Not Taking  . ibuprofen (ADVIL,MOTRIN) 600 MG tablet Take 1 tablet (600 mg total) by mouth every 6 (six) hours. 30 tablet 0 Taking  . Prenatal Vit-Fe Fumarate-FA (MULTIVITAMIN-PRENATAL) 27-0.8 MG TABS tablet Take 1 tablet by mouth daily at 12 noon.   Taking  . witch hazel-glycerin (TUCKS) pad Apply 1 application topically as needed for hemorrhoids. 40 each 12 Taking    Review of Systems  Constitutional: Negative.   Eyes: Negative for visual disturbance.  Gastrointestinal: Negative.   Neurological: Positive for headaches (  none currently).   Physical Exam   Blood pressure 139/83, pulse 69, temperature 98.6 F (37 C), temperature source Oral, resp. rate 20, height  (1.626 m), weight 243 lb (110.2 kg), SpO2 100 %, unknown if currently breastfeeding. Patient Vitals for the past 24 hrs:  BP Temp Temp src Pulse Resp SpO2 Height Weight  07/10/17 1316 139/83 98.6 F (37 C) Oral 69 20 - - -  07/10/17 1300 (!) 168/90 - - 69 - - - -  07/10/17 1246 (!) 165/93 - - 67 - - - -  07/10/17 1231 (!) 163/103 - - 77 - - - -  07/10/17 1216 (!) 151/92 - - 87 - - - -  07/10/17 1207 140/90 - - 86 - - - -  07/10/17 1140 (!) 151/104 99 F (37.2 C) Oral 81 18 100 %  (1.626 m) 243 lb (110.2 kg)    Physical Exam  Nursing note and vitals reviewed. Constitutional: She is oriented to person, place, and time.  She appears well-developed and well-nourished. No distress.  HENT:  Head: Normocephalic and atraumatic.  Eyes: Conjunctivae are normal. Right eye exhibits no discharge. Left eye exhibits no discharge. No scleral icterus.  Neck: Normal range of motion.  Cardiovascular: Normal rate, regular rhythm and normal heart sounds.  No murmur heard. Respiratory: Effort normal and breath sounds normal. No respiratory distress. She has no wheezes.  GI: Soft. She exhibits no distension.  Musculoskeletal: She exhibits no edema.  Neurological: She is alert and oriented to person, place, and time. She has normal reflexes.  No clonus  Skin: Skin is warm and dry. She is not diaphoretic.  Psychiatric: She has a normal mood and affect. Her behavior is normal. Judgment and thought content normal.    MAU Course  Procedures Results for orders placed or performed during the hospital encounter of 07/10/17 (from the past 24 hour(s))  Protein / creatinine ratio, urine     Status: None   Collection Time: 07/10/17 11:42 AM  Result Value Ref Range   Creatinine, Urine 268.00 mg/dL   Total Protein, Urine 11 mg/dL   Protein Creatinine Ratio 0.04 0.00 - 0.15 mg/mg[Cre]  CBC     Status: Abnormal   Collection Time: 07/10/17 12:04 PM  Result Value Ref Range   WBC 8.7 4.0 - 10.5 K/uL   RBC 4.27 3.87 - 5.11 MIL/uL   Hemoglobin 10.9 (L) 12.0 - 15.0 g/dL   HCT 09.8 (L) 11.9 - 14.7 %   MCV 80.1 78.0 - 100.0 fL   MCH 25.5 (L) 26.0 - 34.0 pg   MCHC 31.9 30.0 - 36.0 g/dL   RDW 82.9 56.2 - 13.0 %   Platelets 356 150 - 400 K/uL  Comprehensive metabolic panel     Status: Abnormal   Collection Time: 07/10/17 12:04 PM  Result Value Ref Range   Sodium 138 135 - 145 mmol/L   Potassium 3.2 (L) 3.5 - 5.1 mmol/L   Chloride 105 101 - 111 mmol/L   CO2 22 22 - 32 mmol/L   Glucose, Bld 82 65 - 99 mg/dL   BUN 7 6 - 20 mg/dL   Creatinine, Ser 8.65 0.44 - 1.00 mg/dL   Calcium 8.6 (L) 8.9 - 10.3 mg/dL   Total Protein 7.7 6.5 - 8.1  g/dL   Albumin 3.7 3.5 - 5.0 g/dL   AST 18 15 - 41 U/L   ALT 17 14 - 54 U/L   Alkaline Phosphatase 79 38 - 126 U/L  Total Bilirubin 0.3 0.3 - 1.2 mg/dL   GFR calc non Af Amer >60 >60 mL/min   GFR calc Af Amer >60 >60 mL/min   Anion gap 11 5 - 15    MDM PEC labs WNL. Serum creatinine elevated but at patient's baseline. PCR negative Elevated BPs. Last 2 during eval were severe range. C/w Dr. Jolayne Panther. Will give patient 1 dose of hydral here and d/c home on procardia.   Hydral 10 mg IM -- good response in BP  Assessment and Plan  A; 1. Postpartum hypertension    P: Discharge home Rx procardia 30 mg XL BID BP check in office next week Discussed reasons to return to MAU  Judeth Horn 07/10/2017, 12:18 PM

## 2017-07-10 NOTE — Discharge Instructions (Signed)
Postpartum Hypertension °Postpartum hypertension is high blood pressure after pregnancy that remains higher than normal for more than two days after delivery. You may not realize that you have postpartum hypertension if your blood pressure is not being checked regularly. In some cases, postpartum hypertension will go away on its own, usually within a week of delivery. However, for some women, medical treatment is required to prevent serious complications, such as seizures or stroke. °The following things can affect your blood pressure: °· The type of delivery you had. °· Having received IV fluids or other medicines during or after delivery. ° °What are the causes? °Postpartum hypertension may be caused by any of the following or by a combination of any of the following: °· Hypertension that existed before pregnancy (chronic hypertension). °· Gestational hypertension. °· Preeclampsia or eclampsia. °· Receiving a lot of fluid through an IV during or after delivery. °· Medicines. °· HELLP syndrome. °· Hyperthyroidism. °· Stroke. °· Other rare neurological or blood disorders. ° °In some cases, the cause may not be known. °What increases the risk? °Postpartum hypertension can be related to one or more risk factors, such as: °· Chronic hypertension. In some cases, this may not have been diagnosed before pregnancy. °· Obesity. °· Type 2 diabetes. °· Kidney disease. °· Family history of preeclampsia. °· Other medical conditions that cause hormonal imbalances. ° °What are the signs or symptoms? °As with all types of hypertension, postpartum hypertension may not have any symptoms. Depending on how high your blood pressure is, you may experience: °· Headaches. These may be mild, moderate, or severe. They may also be steady, constant, or sudden in onset (thunderclap headache). °· Visual changes. °· Dizziness. °· Shortness of breath. °· Swelling of your hands, feet, lower legs, or face. In some cases, you may have swelling in  more than one of these locations. °· Heart palpitations or a racing heartbeat. °· Difficulty breathing while lying down. °· Decreased urination. ° °Other rare signs and symptoms may include: °· Sweating more than usual. This lasts longer than a few days after delivery. °· Chest pain. °· Sudden dizziness when you get up from sitting or lying down. °· Seizures. °· Nausea or vomiting. °· Abdominal pain. ° °How is this diagnosed? °The diagnosis of postpartum hypertension is made through a combination of physical examination findings and testing of your blood and urine. You may also have additional tests, such as a CT scan or an MRI, to check for other complications of postpartum hypertension. °How is this treated? °When blood pressure is high enough to require treatment, your options may include: °· Medicines to reduce blood pressure (antihypertensives). Tell your health care provider if you are breastfeeding or if you plan to breastfeed. There are many antihypertensive medicines that are safe to take while breastfeeding. °· Stopping medicines that may be causing hypertension. °· Treating medical conditions that are causing hypertension. °· Treating the complications of hypertension, such as seizures, stroke, or kidney problems. ° °Your health care provider will also continue to monitor your blood pressure closely and repeatedly until it is within a safe range for you. °Follow these instructions at home: °· Take medicines only as directed by your health care provider. °· Get regular exercise after your health care provider tells you that it is safe. °· Follow your health care provider’s recommendations on fluid and salt restrictions. °· Do not use any tobacco products, including cigarettes, chewing tobacco, or electronic cigarettes. If you need help quitting, ask your health care provider. °·   Keep all follow-up visits as directed by your health care provider. This is important. °Contact a health care provider  if: °· Your symptoms get worse. °· You have new symptoms, such as: °? Headache. °? Dizziness. °? Visual changes. °Get help right away if: °· You develop a severe or sudden headache. °· You have seizures. °· You develop numbness or weakness on one side of your body. °· You have difficulty thinking, speaking, or swallowing. °· You develop severe abdominal pain. °· You develop difficulty breathing, chest pain, a racing heartbeat, or heart palpitations. °These symptoms may represent a serious problem that is an emergency. Do not wait to see if the symptoms will go away. Get medical help right away. Call your local emergency services (911 in the [place] Do not drive yourself to the hospital. °This information is not intended to replace advice given to you by your health care provider. Make sure you discuss any questions you have with your health care provider. °Document Released: 10/07/2013 Document Revised: 07/09/2015 Document Reviewed: 08/18/2013 °Elsevier Interactive Patient Education © 2018 [employer] ° °

## 2017-07-10 NOTE — MAU Note (Signed)
At [facility] office today for b/p check and it was high.

## 2017-07-10 NOTE — Progress Notes (Signed)
BP in left arm 128/92, BP in rt arm 133/89. Pt reports occasional headaches.

## 2017-07-10 NOTE — Patient Instructions (Signed)

## 2017-07-10 NOTE — Progress Notes (Signed)
   Subjective:    Patient ID: Brandi Norris, female    DOB: 1995/03/28, 22 y.o.   MRN: [id number]  Presents for postpartum BP check.  Had a vaginal delivery 07/01/17.  Had gestational hypertension.  Has been having daily headaches, which is a new symptom for her Was not on MgSO4 in hospittal, as she was not severe.    No problems with delivery.  BPs in hospital were mildly hypertensive (140s/high 80s-low 90s  Was not put on meds at discharge  Hypertension  This is a recurrent problem. Associated symptoms include headaches, neck pain and peripheral edema. Pertinent negatives include no blurred vision, chest pain, palpitations or shortness of breath. There are no associated agents to hypertension. There are no compliance problems.    Review of Systems  Eyes: Negative for blurred vision.  Respiratory: Negative for shortness of breath.   Cardiovascular: Negative for chest pain and palpitations.  Musculoskeletal: Positive for neck pain.  Neurological: Positive for headaches.       Objective:   Physical Exam  Constitutional: She is oriented to person, place, and time. She appears well-developed and well-nourished. No distress.  HENT:  Head: Normocephalic.  Cardiovascular: Normal rate and regular rhythm.  Pulmonary/Chest: Effort normal. No respiratory distress.  Abdominal: Soft. She exhibits no distension and no mass. There is no tenderness. There is no rebound and no guarding.  Musculoskeletal: Normal range of motion.  Neurological: She is alert and oriented to person, place, and time. She displays normal reflexes. No cranial nerve deficit. She exhibits normal muscle tone. Coordination normal.  Skin: Skin is warm and dry.  Psychiatric: She has a normal mood and affect.          Assessment & Plan:  Postpartum hypertension with headache Rule out preeclampsia  Sent to [facility] for labs and urine Followup here 3-4 weeks

## 2017-07-23 ENCOUNTER — Other Ambulatory Visit (INDEPENDENT_AMBULATORY_CARE_PROVIDER_SITE_OTHER): Payer: Medicaid Other

## 2017-07-23 VITALS — BP 117/76 | HR 72

## 2017-07-23 DIAGNOSIS — R399 Unspecified symptoms and signs involving the genitourinary system: Secondary | ICD-10-CM | POA: Diagnosis not present

## 2017-07-23 NOTE — Progress Notes (Signed)
I have reviewed the chart and agree with nursing staff's documentation of this patient's encounter.  Catalina AntiguaPeggy Eathen Budreau, MD 07/23/2017 10:51 AM

## 2017-07-23 NOTE — Progress Notes (Addendum)
Pt presents to the office for UTI symptoms. Pt states that she notices a  strong odor and tingling when she urinates. Pt also reports the feeling of passing glass when she has bowel movements.Pt states that she notices blood when she wipes after a bowel movement. Pt  advised to use miralax daily and to drink water. Urine sample was sent to the lab. Pt voiced understanding.

## 2017-07-25 LAB — URINE CULTURE

## 2017-08-13 ENCOUNTER — Ambulatory Visit (INDEPENDENT_AMBULATORY_CARE_PROVIDER_SITE_OTHER): Payer: Medicaid Other | Admitting: Family Medicine

## 2017-08-13 ENCOUNTER — Encounter: Payer: Self-pay | Admitting: Family Medicine

## 2017-08-13 DIAGNOSIS — Z1389 Encounter for screening for other disorder: Secondary | ICD-10-CM | POA: Diagnosis not present

## 2017-08-13 DIAGNOSIS — M545 Low back pain, unspecified: Secondary | ICD-10-CM

## 2017-08-13 DIAGNOSIS — O165 Unspecified maternal hypertension, complicating the puerperium: Secondary | ICD-10-CM

## 2017-08-13 DIAGNOSIS — M9903 Segmental and somatic dysfunction of lumbar region: Secondary | ICD-10-CM | POA: Diagnosis not present

## 2017-08-13 MED ORDER — ETONOGESTREL-ETHINYL ESTRADIOL 0.12-0.015 MG/24HR VA RING: VAGINAL_RING | VAGINAL | 3 refills | Status: DC

## 2017-08-13 MED ORDER — NIFEDIPINE ER OSMOTIC RELEASE 30 MG PO TB24: 30.0000 mg | ORAL_TABLET | Freq: Two times a day (BID) | ORAL | 2 refills | Status: DC

## 2017-08-13 NOTE — Progress Notes (Signed)
Patient states she is still taking Procardia 30 mg but is almost out of refills. Armandina StammerJennifer Howard RN   Post Partum Exam  Janicia Laural BenesJohnson is a 22 y.o. [phone] female who presents for a postpartum visit. She is 6 weeks postpartum following a spontaneous vaginal delivery. I have fully reviewed the prenatal and intrapartum course. The delivery was at 39.4 gestational weeks.  Anesthesia: epidural. Postpartum course has been uneventful. Baby's course has been uneventful. Baby is feeding by bottle - Enfamil Gentlese. Bleeding red and clots. Bowel function is normal- but painful when passing. Bladder function is normal. Patient is not sexually active. Contraception method is NuvaRing vaginal inserts. Postpartum depression screening:neg  The following portions of the patient's history were reviewed and updated as appropriate: allergies, current medications, past family history, past medical history, past social history, past surgical history and problem list. Last pap smear done 12/05/16 and was Normal  Review of Systems Pertinent items are noted in HPI.    Objective:  Blood pressure (!) 124/96, pulse 86, height 5\' 4"  (1.626 m), weight 246 lb (111.6 kg), last menstrual period 08/10/2017, unknown if currently breastfeeding.  General:  alert, cooperative and no distress  Lungs: clear to auscultation bilaterally  Heart:  regular rate and rhythm, S1, S2 normal, no murmur, click, rub or gallop  Abdomen: soft, non-tender; bowel sounds normal; no masses,  no organomegaly   MSK: Restriction, tenderness, tissue texture changes, and paraspinal spasm in the left lumbar spine  Neuro: Moves all four extremities with no focal neurological deficit   OSE: Head   Cervical   Thoracic   Rib   Lumbar L5 ESRL  Sacrum   Pelvis            Assessment:    Postpartum care and examination  Postpartum hypertension  Acute left-sided low back pain without sciatica  Somatic dysfunction of spine, lumbar  Pap  smear not done at today's visit.   Plan:   1. Contraception: NuvaRing vaginal inserts 2. OMT done - pt tolerated well. 3. Continue Procardia. Establish care with PCP. 4. Follow up in: 1 year or as needed.

## 2017-08-25 ENCOUNTER — Encounter: Payer: Self-pay | Admitting: Family Medicine

## 2017-09-14 ENCOUNTER — Ambulatory Visit: Payer: Medicaid Other | Admitting: Family Medicine

## 2018-08-13 ENCOUNTER — Encounter: Payer: Self-pay | Admitting: Family Medicine

## 2018-08-13 ENCOUNTER — Ambulatory Visit (INDEPENDENT_AMBULATORY_CARE_PROVIDER_SITE_OTHER): Payer: Medicaid Other | Admitting: Family Medicine

## 2018-08-13 ENCOUNTER — Other Ambulatory Visit: Payer: Self-pay

## 2018-08-13 ENCOUNTER — Other Ambulatory Visit (HOSPITAL_COMMUNITY)
Admission: RE | Admit: 2018-08-13 | Discharge: 2018-08-13 | Disposition: A | Discharge status: Home / Self Care | Payer: Medicaid Other | Source: Ambulatory Visit | Attending: Family Medicine | Admitting: Family Medicine

## 2018-08-13 DIAGNOSIS — O09291 Supervision of pregnancy with other poor reproductive or obstetric history, first trimester: Secondary | ICD-10-CM

## 2018-08-13 DIAGNOSIS — Z3A08 8 weeks gestation of pregnancy: Secondary | ICD-10-CM

## 2018-08-13 DIAGNOSIS — D563 Thalassemia minor: Secondary | ICD-10-CM

## 2018-08-13 DIAGNOSIS — Z3480 Encounter for supervision of other normal pregnancy, unspecified trimester: Secondary | ICD-10-CM | POA: Diagnosis present

## 2018-08-13 DIAGNOSIS — O99211 Obesity complicating pregnancy, first trimester: Secondary | ICD-10-CM

## 2018-08-13 DIAGNOSIS — Z8759 Personal history of other complications of pregnancy, childbirth and the puerperium: Secondary | ICD-10-CM

## 2018-08-13 MED ORDER — DOXYLAMINE-PYRIDOXINE 10-10 MG PO TBEC: 2.0000 | DELAYED_RELEASE_TABLET | Freq: Every day | ORAL | 5 refills | Status: DC

## 2018-08-13 NOTE — Progress Notes (Signed)
Subjective:  Brandi Norris is a [id number] 41w6dbeing seen today for her first obstetrical visit.  Her obstetrical history is significant for h/o GHTN. FOB involved - [place]. Different FOB from first pregnancy.. Patient does intend to breast feed. Pregnancy history fully reviewed.  Patient reports nausea.  BP 120/76   Pulse 98   Wt 269 lb 1.3 oz (122.1 kg)   LMP 06/12/2018   BMI 46.19 kg/m   HISTORY: OB History  Gravida Para Term Preterm AB Living  '4 1 1   2 1  '$ SAB TAB Ectopic Multiple Live Births  2     0 1    # Outcome Date GA Lbr Len/2nd Weight Sex Delivery Anes PTL Lv  4 Current           3 Term 07/01/17 326w4d7:30 / 00:40 7 lb 3.3 oz (3.27 kg) M Vag-Spont EPI  LIV  2 SAB           1 SAB             Past Medical History:  Diagnosis Date  . Miscarriage   . Pregnancy induced hypertension     Past Surgical History:  Procedure Laterality Date  . DILATION AND CURETTAGE OF UTERUS    . WISDOM TOOTH EXTRACTION      Family History  Problem Relation Age of Onset  . Stroke Mother   . Fibromyalgia Mother   . Neuropathy Mother   . Bursitis Mother   . Osteoarthritis Mother   . Aneurysm Father   . Brain cancer Maternal Aunt   . Throat cancer Maternal Uncle   . Breast cancer Paternal Aunt   . Diabetes Maternal Grandmother   . Hypertension Maternal Grandmother   . Heart disease Paternal Grandmother      Exam  BP 120/76   Pulse 98   Wt 269 lb 1.3 oz (122.1 kg)   LMP 06/12/2018   BMI 46.19 kg/m   CONSTITUTIONAL: Well-developed, well-nourished female in no acute distress.  HENT:  Normocephalic, atraumatic, External right and left ear normal. Oropharynx is clear and moist EYES: Conjunctivae and EOM are normal. Pupils are equal, round, and reactive to light. No scleral icterus.  NECK: Normal range of motion, supple, no masses.  Normal thyroid.  CARDIOVASCULAR: Normal heart rate noted, regular rhythm RESPIRATORY: Clear to auscultation bilaterally. Effort and breath  sounds normal, no problems with respiration noted. BREASTS: Symmetric in size. No masses, skin changes, nipple drainage, or lymphadenopathy. ABDOMEN: Soft, normal bowel sounds, no distention noted.  No tenderness, rebound or guarding.  PELVIC: Normal appearing external genitalia; normal appearing vaginal mucosa and cervix. No abnormal discharge noted. Normal uterine size, no other palpable masses, no uterine or adnexal tenderness. MUSCULOSKELETAL: Normal range of motion. No tenderness.  No cyanosis, clubbing, or edema.  2+ distal pulses. SKIN: Skin is warm and dry. No rash noted. Not diaphoretic. No erythema. No pallor. NEUROLOGIC: Alert and oriented to person, place, and time. Normal reflexes, muscle tone coordination. No cranial nerve deficit noted. PSYCHIATRIC: Normal mood and affect. Normal behavior. Normal judgment and thought content.    Assessment:    Pregnancy: [id number] Active Problem List   Diagnosis Date Noted  . Supervision of other normal pregnancy, antepartum 08/13/2018  . History of gestational hypertension 08/13/2018  . Alpha thalassemia trait 03/02/2017  . [place]      Plan:   1. Supervision of other normal pregnancy, antepartum FHT and fh normal Desires panorama - will draw  next visit Plans on breastfeeding. - Culture, OB Urine - GC/Chlamydia probe amp ([place])not at [facility] - Obstetric Panel, Including HIV - CHL AMB BABYSCRIPTS SCHEDULE OPTIMIZATION - Comp Met (CMET) - Protein / creatinine ratio, urine  2. History of gestational hypertension CMP, UPC ASA '81mg'$  at 12 weeks  3. Morbid obesity ([place])  4. Alpha thalassemia trait     Problem list reviewed and updated. 75% of 30 min visit spent on counseling and coordination of care.     Truett Mainland 08/13/2018

## 2018-08-13 NOTE — Progress Notes (Signed)
DATING AND VIABILITY SONOGRAM   Brandi Norris is a 23 y.o. year old G65P1021 with LMP Patient's last menstrual period was 06/12/2018 (exact date). which would correlate to  [redacted]w[redacted]d weeks gestation.  She has regular menstrual cycles.   She is here today for a confirmatory initial sonogram.    GESTATION: [place]     FETAL ACTIVITY:          Heart rate         175          The fetus is active.   GESTATIONAL AGE AND  BIOMETRICS:  Gestational criteria: Estimated Date of Delivery: 03/19/19 by LMP now at [redacted]w[redacted]d  Previous Scans:0      CROWN RUMP LENGTH           1.86 cm         8-2weeks                                                                               AVERAGE EGA(BY THIS SCAN):  8-2 weeks  WORKING EDD( LMP ): 03/19/19     TECHNICIAN COMMENTS: Patient informed that the ultrasound is considered a limited obstetric ultrasound and is not intended to be a complete ultrasound exam. Patient also informed that the ultrasound is not being completed with the intent of assessing for fetal or placental anomalies or any pelvic abnormalities. Explained that the purpose of today's ultrasound is to assess for fetal heart rate. Patient acknowledges the purpose of the exam and the limitations of the study.     Kathrene Alu 08/13/2018 9:44 AM

## 2018-08-14 LAB — COMPREHENSIVE METABOLIC PANEL
ALT: 15 IU/L (ref 0–32)
AST: 12 IU/L (ref 0–40)
Albumin/Globulin Ratio: 1.6 (ref 1.2–2.2)
Albumin: 4.5 g/dL (ref 3.9–5.0)
Alkaline Phosphatase: 62 IU/L (ref 39–117)
BUN/Creatinine Ratio: 7 — ABNORMAL LOW (ref 9–23)
BUN: 5 mg/dL — ABNORMAL LOW (ref 6–20)
Bilirubin Total: <0.2 mg/dL (ref 0.0–1.2)
CO2: 22 mmol/L (ref 20–29)
Calcium: 9.5 mg/dL (ref 8.7–10.2)
Chloride: 101 mmol/L (ref 96–106)
Creatinine, Ser: 0.68 mg/dL (ref 0.57–1.00)
GFR calc Af Amer: 142 mL/min/{1.73_m2} (ref >59)
GFR calc non Af Amer: 123 mL/min/{1.73_m2} (ref >59)
Globulin, Total: 2.8 g/dL (ref 1.5–4.5)
Glucose: 81 mg/dL (ref 65–99)
Potassium: 3.8 mmol/L (ref 3.5–5.2)
Sodium: 135 mmol/L (ref 134–144)
Total Protein: 7.3 g/dL (ref 6.0–8.5)

## 2018-08-14 LAB — OBSTETRIC PANEL, INCLUDING HIV
Antibody Screen: NEGATIVE
Basophils Absolute: 0.1 10*3/uL (ref 0.0–0.2)
Basos: 1 %
EOS (ABSOLUTE): 0.1 10*3/uL (ref 0.0–0.4)
Eos: 1 %
HIV Screen 4th Generation wRfx: NONREACTIVE
Hematocrit: 37.6 % (ref 34.0–46.6)
Hemoglobin: 12.2 g/dL (ref 11.1–15.9)
Hepatitis B Surface Ag: NEGATIVE
Immature Grans (Abs): 0 10*3/uL (ref 0.0–0.1)
Immature Granulocytes: 0 %
Lymphocytes Absolute: 2.4 10*3/uL (ref 0.7–3.1)
Lymphs: 24 %
MCH: 25.3 pg — ABNORMAL LOW (ref 26.6–33.0)
MCHC: 32.4 g/dL (ref 31.5–35.7)
MCV: 78 fL — ABNORMAL LOW (ref 79–97)
Monocytes Absolute: 0.6 10*3/uL (ref 0.1–0.9)
Monocytes: 6 %
Neutrophils Absolute: 6.7 10*3/uL (ref 1.4–7.0)
Neutrophils: 68 %
Platelets: 300 10*3/uL (ref 150–450)
RBC: 4.83 x10E6/uL (ref 3.77–5.28)
RDW: 15.1 % (ref 11.7–15.4)
RPR Ser Ql: NONREACTIVE
Rh Factor: POSITIVE
Rubella Antibodies, IGG: 5.68 index (ref >0.99)
WBC: 9.9 10*3/uL (ref 3.4–10.8)

## 2018-08-14 LAB — PROTEIN / CREATININE RATIO, URINE
Creatinine, Urine: 340.7 mg/dL
Protein, Ur: 25.1 mg/dL
Protein/Creat Ratio: 74 mg/g creat (ref 0–200)

## 2018-08-16 LAB — CULTURE, OB URINE

## 2018-08-16 LAB — URINE CULTURE, OB REFLEX

## 2018-08-17 ENCOUNTER — Other Ambulatory Visit: Payer: Self-pay | Admitting: Family Medicine

## 2018-08-17 LAB — GC/CHLAMYDIA PROBE AMP (~~LOC~~) NOT AT ARMC
Chlamydia: NEGATIVE
Neisseria Gonorrhea: NEGATIVE

## 2018-08-17 MED ORDER — AMOXICILLIN 875 MG PO TABS: 875.0000 mg | ORAL_TABLET | Freq: Two times a day (BID) | ORAL | 0 refills | Status: AC

## 2018-08-17 MED ORDER — FLUCONAZOLE 150 MG PO TABS: 150.0000 mg | ORAL_TABLET | Freq: Once | ORAL | 0 refills | Status: AC

## 2018-08-19 MED ORDER — MICONAZOLE NITRATE 2 % VA CREA: 1.0000 | TOPICAL_CREAM | Freq: Every day | VAGINAL | 2 refills | Status: DC

## 2018-08-19 NOTE — Addendum Note (Signed)
Addended by: Truett Mainland on: 08/19/2018 04:36 PM   Modules accepted: Orders

## 2018-08-23 ENCOUNTER — Other Ambulatory Visit: Payer: Self-pay

## 2018-08-23 DIAGNOSIS — Z3480 Encounter for supervision of other normal pregnancy, unspecified trimester: Secondary | ICD-10-CM

## 2018-08-23 MED ORDER — VITAFOL GUMMIES 3.33-0.333-34.8 MG PO CHEW
34.8000 mg | CHEWABLE_TABLET | Freq: Three times a day (TID) | ORAL | 10 refills | Status: DC
Start: 2018-08-23 — End: 2021-07-11

## 2018-08-31 ENCOUNTER — Telehealth: Payer: Self-pay

## 2018-08-31 NOTE — Telephone Encounter (Signed)
Patient called stating that she has had a sore throat for a few days and is now having thick mucous with cough. Patient is 11=[redacted] weeks pregnant.  Patient made aware that she can run humidifier by bed and cam try sudafed. Patient denies any fever at this time. Patient made aware I will route message to provider to see if she needs to be tested for Covid-19. Patient denies having any known exposure to Covid-19.  Kathrene Alu RN

## 2018-08-31 NOTE — Telephone Encounter (Signed)
Patient called and made aware that the plan should be that she call her PCP- patient states she has one to see about getting Covid testing since she is symptomatic. Patient states understanding and agrees to plan and will call back if she has any trouble getting testing. Kathrene Alu RN

## 2018-09-08 ENCOUNTER — Telehealth: Payer: Self-pay

## 2018-09-08 MED ORDER — AMBULATORY NON FORMULARY MEDICATION: 1.0000 | 0 refills | Status: DC

## 2018-09-08 NOTE — Telephone Encounter (Signed)
Patient needs BP cuff sent in.Will order for patient and fax Rx Kathrene Alu RN

## 2018-09-17 ENCOUNTER — Other Ambulatory Visit: Payer: Self-pay

## 2018-09-17 ENCOUNTER — Ambulatory Visit (INDEPENDENT_AMBULATORY_CARE_PROVIDER_SITE_OTHER): Payer: Medicaid Other | Admitting: Obstetrics & Gynecology

## 2018-09-17 ENCOUNTER — Encounter: Payer: Self-pay | Admitting: Obstetrics & Gynecology

## 2018-09-17 VITALS — BP 104/76 | HR 97 | Wt 258.0 lb

## 2018-09-17 DIAGNOSIS — Z3480 Encounter for supervision of other normal pregnancy, unspecified trimester: Secondary | ICD-10-CM

## 2018-09-17 DIAGNOSIS — Z3A13 13 weeks gestation of pregnancy: Secondary | ICD-10-CM

## 2018-09-17 DIAGNOSIS — O26891 Other specified pregnancy related conditions, first trimester: Secondary | ICD-10-CM

## 2018-09-17 DIAGNOSIS — D563 Thalassemia minor: Secondary | ICD-10-CM

## 2018-09-17 DIAGNOSIS — Z8759 Personal history of other complications of pregnancy, childbirth and the puerperium: Secondary | ICD-10-CM

## 2018-09-17 NOTE — Progress Notes (Signed)
Patient has not gotten BP cuff yet. Patient orders have been sent to medical supply- will resend orders. Kathrene Alu RN  Had to obtain FHR using ultrasound.FM noted on ultrasound.

## 2018-09-17 NOTE — Progress Notes (Signed)
   PRENATAL VISIT NOTE  Subjective:  Brandi Norris is a 23 y.o. Q6S3419 at [redacted]w[redacted]d being seen today for ongoing prenatal care.  She is currently monitored for the following issues for this high-risk pregnancy and has Morbid obesity ([place]); Alpha thalassemia trait; Supervision of other normal pregnancy, antepartum; and History of gestational hypertension on their problem list.  Patient reports no complaints.  Contractions: Not present. Vag. Bleeding: None.   . Denies leaking of fluid.   The following portions of the patient's history were reviewed and updated as appropriate: allergies, current medications, past family history, past medical history, past social history, past surgical history and problem list.   Objective:   Vitals:   09/17/18 0908  BP: 104/76  Pulse: 97  Weight: 258 lb (117 kg)    Fetal Status:           General:  Alert, oriented and cooperative. Patient is in no acute distress.  Skin: Skin is warm and dry. No rash noted.   Cardiovascular: Normal heart rate noted  Respiratory: Normal respiratory effort, no problems with respiration noted  Abdomen: Soft, gravid, appropriate for gestational age.  Pain/Pressure: Absent     Pelvic: Cervical exam deferred        Extremities: Normal range of motion.  Edema: None  Mental Status: Normal mood and affect. Normal behavior. Normal judgment and thought content.   Assessment and Plan:  Pregnancy: G4P1021 at [redacted]w[redacted]d 1. Supervision of other normal pregnancy, antepartum FH via [place] - Genetic Screening - US MFM OB DETAIL +14 [place]; Future  2. History of gestational hypertension  - [place] MFM OB DETAIL +14 WK; Future Begin baby ASA at 16 weeks   3. Morbid obesity ([place]) - [place] MFM OB DETAIL +14 WK; Future  4. Alpha thalassemia trait - [place] MFM OB DETAIL +14 WK; Future  Preterm labor symptoms and general obstetric precautions including but not limited to vaginal bleeding, contractions, leaking of fluid and fetal movement were reviewed in  detail with the patient. Please refer to After Visit Summary for other counseling recommendations.   Return in about 6 weeks (around 10/29/2018).  No future appointments.  Lavonia Drafts, MD

## 2018-09-21 ENCOUNTER — Other Ambulatory Visit (HOSPITAL_COMMUNITY): Payer: Medicaid Other

## 2018-09-24 ENCOUNTER — Telehealth: Payer: Self-pay

## 2018-09-24 NOTE — Telephone Encounter (Signed)
Patient called and made aware that Panorama results are all low risk. Patient made aware the sex of the baby- female.Patient did report one episode of bleeding that happened last weekend. Patient called after hours line and spoke with nurse that advised her to go to hospital. Patient states she didn't go because of transportation issue. Patient advised if she has a significant amount of bleeding again she should go for evaluation. Patient states understanding. Kathrene Alu RN

## 2018-10-05 ENCOUNTER — Telehealth: Payer: Self-pay | Admitting: Family Medicine

## 2018-10-05 DIAGNOSIS — D563 Thalassemia minor: Secondary | ICD-10-CM

## 2018-10-05 NOTE — Telephone Encounter (Signed)
-----   Message from Doran Heater, RN sent at 10/05/2018 10:43 AM EDT ----- Patient carrier screen scanned in-  Abnormal results- patient not aware; needs provider call. Kathrene Alu RN

## 2018-10-05 NOTE — Telephone Encounter (Signed)
Talked with patient about carrier screen - patient desired referral to Genetic counseling.

## 2018-10-07 ENCOUNTER — Telehealth: Payer: Self-pay

## 2018-10-07 NOTE — Telephone Encounter (Signed)
Baby scripts called to report pt's elevated Bps. Pt states that her first BP was 140/97,144/97,133/91. Pt states that she has a hx of migraines and she just received her BP machine and doesn't think she knows how to properly use it. Advised pt to come to the office so that we can show her how to properly use the machine. Pt denies blurred vision, dizziness, and swelling in hands and feet states headache is gone and she was busy trying to calm toddler down when she took her BP. Advised pt to call us if she has another elevated BP because we will need to send her to [facility]. Understanding was voiced.  Damarco Keysor l Breland Trouten, CMA

## 2018-10-08 ENCOUNTER — Other Ambulatory Visit: Payer: Self-pay

## 2018-10-08 DIAGNOSIS — O285 Abnormal chromosomal and genetic finding on antenatal screening of mother: Secondary | ICD-10-CM

## 2018-10-08 DIAGNOSIS — D563 Thalassemia minor: Secondary | ICD-10-CM

## 2018-10-08 NOTE — Progress Notes (Signed)
Dr. Nehemiah Settle has discussed carrier screen with increased risk of SMA with patient. Patient would like to discuss with genetics counselor. Kathrene Alu RN

## 2018-10-11 ENCOUNTER — Telehealth: Payer: Self-pay

## 2018-10-11 NOTE — Telephone Encounter (Signed)
Pt called the office stating she has some questions about being at risk for SMA. Pt made aware that I will send message to provider. Understanding was voiced.  chiquita l wilson, CMA

## 2018-10-13 ENCOUNTER — Other Ambulatory Visit: Payer: Medicaid Other

## 2018-10-13 ENCOUNTER — Other Ambulatory Visit: Payer: Self-pay

## 2018-10-13 VITALS — BP 108/73 | HR 103 | Ht 64.0 in | Wt 251.1 lb

## 2018-10-13 DIAGNOSIS — R399 Unspecified symptoms and signs involving the genitourinary system: Secondary | ICD-10-CM

## 2018-10-13 LAB — POCT URINALYSIS DIPSTICK OB
Glucose, UA: NEGATIVE
Ketones, UA: POSITIVE
Leukocytes, UA: NEGATIVE
Nitrite, UA: NEGATIVE
Spec Grav, UA: >=1.03 — AB (ref 1.010–1.025)
pH, UA: 6 (ref 5.0–8.0)

## 2018-10-13 NOTE — Progress Notes (Unsigned)
Pt states that she has been experiencing pain in lower back and urine has had a strong odor x 1 month. Urine culture was sent to the lab. Rosie Golson l Blimi Godby, CMA

## 2018-10-15 LAB — CULTURE, OB URINE

## 2018-10-15 LAB — URINE CULTURE, OB REFLEX

## 2018-10-22 ENCOUNTER — Ambulatory Visit (HOSPITAL_COMMUNITY): Payer: Medicaid Other

## 2018-10-28 ENCOUNTER — Other Ambulatory Visit (HOSPITAL_COMMUNITY): Payer: Self-pay | Admitting: *Deleted

## 2018-10-28 ENCOUNTER — Telehealth: Payer: Self-pay

## 2018-10-28 ENCOUNTER — Ambulatory Visit (HOSPITAL_COMMUNITY): Payer: Medicaid Other | Admitting: *Deleted

## 2018-10-28 ENCOUNTER — Ambulatory Visit (HOSPITAL_COMMUNITY): Payer: Self-pay | Admitting: Genetic Counselor

## 2018-10-28 ENCOUNTER — Other Ambulatory Visit: Payer: Self-pay

## 2018-10-28 ENCOUNTER — Encounter (HOSPITAL_COMMUNITY): Payer: Self-pay

## 2018-10-28 ENCOUNTER — Ambulatory Visit (HOSPITAL_BASED_OUTPATIENT_CLINIC_OR_DEPARTMENT_OTHER): Payer: Medicaid Other | Admitting: Genetic Counselor

## 2018-10-28 ENCOUNTER — Ambulatory Visit (HOSPITAL_COMMUNITY)
Admission: RE | Admit: 2018-10-28 | Discharge: 2018-10-28 | Disposition: A | Discharge status: Home / Self Care | Payer: Medicaid Other | Source: Ambulatory Visit | Attending: Obstetrics and Gynecology | Admitting: Obstetrics and Gynecology

## 2018-10-28 VITALS — BP 105/52 | HR 90 | Temp 98.9°F | Wt 252.4 lb

## 2018-10-28 DIAGNOSIS — Z3480 Encounter for supervision of other normal pregnancy, unspecified trimester: Secondary | ICD-10-CM

## 2018-10-28 DIAGNOSIS — O0990 Supervision of high risk pregnancy, unspecified, unspecified trimester: Secondary | ICD-10-CM

## 2018-10-28 DIAGNOSIS — O99212 Obesity complicating pregnancy, second trimester: Secondary | ICD-10-CM

## 2018-10-28 DIAGNOSIS — D563 Thalassemia minor: Secondary | ICD-10-CM

## 2018-10-28 DIAGNOSIS — O09292 Supervision of pregnancy with other poor reproductive or obstetric history, second trimester: Secondary | ICD-10-CM | POA: Diagnosis not present

## 2018-10-28 DIAGNOSIS — Z148 Genetic carrier of other disease: Secondary | ICD-10-CM

## 2018-10-28 DIAGNOSIS — Z315 Encounter for genetic counseling: Secondary | ICD-10-CM | POA: Diagnosis not present

## 2018-10-28 DIAGNOSIS — Z3A19 19 weeks gestation of pregnancy: Secondary | ICD-10-CM

## 2018-10-28 DIAGNOSIS — Z31430 Encounter of female for testing for genetic disease carrier status for procreative management: Secondary | ICD-10-CM

## 2018-10-28 DIAGNOSIS — Z8759 Personal history of other complications of pregnancy, childbirth and the puerperium: Secondary | ICD-10-CM | POA: Diagnosis present

## 2018-10-28 DIAGNOSIS — Z362 Encounter for other antenatal screening follow-up: Secondary | ICD-10-CM

## 2018-10-28 NOTE — Telephone Encounter (Signed)
Returned patients call to answer her questions/ concerns. Left message. Kathrene Alu RN

## 2018-10-28 NOTE — Progress Notes (Signed)
10/28/2018  Brandi Norris 03/21/1995 MRN: [id number] DOV: 10/28/2018  Brandi Norris presented to the [facility] for a genetics consultation regarding her carrier status for alpha-thalassemia and spinal muscular atrophy. Ms. Pick came to her appointment alone due to COVID-19 visitor restrictions.   Indication for genetic counseling - Silent carrier for alpha-thalassemia - Increased risk to be silent carrier for spinal muscular atrophy (SMA)  Prenatal history  Brandi Norris is a [id number], 23 y.o. female. Her current pregnancy has completed [redacted]w[redacted]d(Estimated Date of Delivery: 03/19/19).  Ms. JWagonerdenied exposure to environmental toxins or chemical agents. She denied the use of alcohol, tobacco or street drugs. She reported taking prenatal vitamins, vitamin B6, and Tylenol. She denied significant viral illnesses and fevers during the course of her pregnancy. She reported bleeding in both the first and second trimesters. Her medical and surgical histories were noncontributory.  Family History  A three generation pedigree was drafted and reviewed. The family history is remarkable for the following:  - Brandi Norris's maternal half sister has a son with learning problems and developmental delays who was exposed to alcohol and potentially other substances in utero. Ms. JKastenspartner, DAbbie Norris has a maternal half brother who also has a son with a learning disability. We discussed that manytimes, learning difficulties are multifactorial in nature, occurring due to a combination of genetic and environmental factorsthat are difficult to identify.There is also a known association between prenatal exposure to substances such as alcohol and learning disabilities.   - Ms. JHadlockhas a maternal half aunt through her maternal grandmother who has a son with autism. This rest of this individual's history is noncontributory. We reviewed that autism can be isolated,  multifactorial, or part of a genetic syndrome; however, underlying genetic conditions account for less than 10% of autism diagnoses. Recurrence risk for first-degree relatives of individuals with idiopathic autism is estimated to be between 2-8%. Given that Brandi Norris's relative with autism is a fifth-degree relative to her fetus, the risk of recurrence is likely not elevated above the general population risk of ~1 in 54, or 1.5%.  - Brandi Norris's maternal half aunt discussed above also has a history of three miscarriages. We reviewed that there are many potential causes of recurrent miscarriages, including anatomic, immunologic, endocrine, and genetic factors. However, a cause is only identified in 50% of individuals who experience recurrent miscarriages. Abnormalities of chromosome number or structure are the most common cause of sporadic early pregnancy loss. A significant proportion of recurrent miscarriages may also be associated with structural or numerical chromosome abnormalities. We discussed that 2-5% of couples who experience multiple miscarriages carry a balanced translocation that can become unbalanced and potentially lethal in their offspring. In addition to chromosomal abnormalities, certain single gene, X-linked, and polygenic/multifactorial disorders are associated with recurrent miscarriage. Ms. JBinningwas informed if she were interested, her aunt could have a consultation with a prenatal genetic counselor.  - Ms. JSirmonsreported that she has limited information about her maternal grandfather's family history due to a complicated social situation. However, she is aware of an "illness that runs in that part of the family", and knows that she has half aunts and half uncles through her maternal grandfather that "die early". Without detailed information about the etiology of these individuals' conditions, risk assessment is limited. If Ms. JWoolvertonlearns more about this family history, she is  encouraged to notify me so I can provide a more accurate risk assessment.  - Ms.  Norris's maternal grandmother has a brother who was born with "three ears". Brandi Norris was born with bilateral polydactyly of his hands. The rest of his developmental and medical history is noncontributory. We discussed that ear abnormalities and polydactyly are both forms of congenital birth defects. In every pregnancy, a woman starts out with a 3-5% chance of having a baby with any birth defect. However, isolated polydactyly in particular can appear to run in families. Thus, there is up to a 50% risk that the couple's children could also have polydactyly in addition to the risk for other forms of birth defects.  - Brandi Norris had a son from a previous relationship that died at the age of three months. This baby was born preterm at 48 weeks' gestation. He developed hydrocephalus, which is a condition where excess cerebrospinal fluid accumulates in the brain. Hydrocephalus causes widening of the ventricles in the brain that increases pressure on the brain tissue itself. Preterm infants are at risk for perinatal complications included intraventricular hemorrhage (IVH) and subsequent hydrocephalus. Since this infant was born extremely early, it is likely that his hydrocephalus was a result of his prematurity.  The remaining family histories were reviewed and found to be noncontributory for birth defects, intellectual disability, recurrent pregnancy loss, and known genetic conditions. Brandi Norris had limited information about her partner's extended family history; thus, risk assessment was limited.  The patient's ethnicity is [place] and [place]. The father of the pregnancy's ethnicity is [place]. Ashkenazi Jewish ancestry and consanguinity were denied. Pedigree will be scanned under Media.  Discussion  Brandi Norris had Horizon-14 carrier screening performed through [place]. The results of the screen identified her as a  silent carrier for alpha-thalassemia (aa/a-). Alpha-thalassemia is different in its inheritance compared to other hemoglobinopathies as there are two alpha globin genes on each chromosome 16, or four alpha globin genes total (aa/aa). A person can be a carrier of one alpha gene mutation (aa/a-), also referred to as a "silent carrier". A person who carries two alpha globin gene mutations can either carry them in cis (both on the same chromosome, denoted as aa/--) or in trans (on different chromosomes, denoted as a-/a-). Alpha-thalassemia carriers of two mutations who have African American ancestry are more likely to have a trans arrangement (a-/a-); cis configuration is reported to be rare in individuals with African American ancestry.     There are several different forms of alpha-thalassemia. The most severe form of alpha-thalassemia, Hb Barts, is associated with an absence of alpha globin chain synthesis as a result of deletions of all four alpha globin genes (--/--).  Given that Ms. Langi is a silent carrier (aa/a-), her pregnancies would not be at increased risk for Hb Barts, even if her partner is a carrier for alpha-thalassemia. Hemoglobin H (HbH) disease is caused by three deleted or dysfunctioning alpha globin alleles (a-/--) and is characterized by microcytic hypochromic hemolytic anemia, hepatosplenomegaly, mild jaundice, and sometimes thalassemia-like bone changes. Given Ms. Sliter silent carrier status (aa/a-), the current fetus would only be at risk for HbH disease (a-/--), if her partner is a carrier for two alpha globin mutations in cis (aa/--). If this is the case, the risk for HbH disease in the pregnancy would be 1 in 4 (25%). However, if Ms. Mceachron partner is a carrier for two alpha globin mutations, he would be more likely to carry them in trans configuration (a-/a-) than the cis configuration (aa/--), given his ethnicity. If he is a carrier of alpha-thalassemia in  trans, then the  pregnancy would not be at increased risk for HbH disease. Based on the carrier frequency for alpha-thalassemia in the African American population, Ms. Remley partner has a 1 in 30 chance of being any type of carrier for alpha-thalassemia.   Ms. Brumbaugh was also found to have 2 copies of the SMN1 gene on carrier screening; however, she also has the c.*3+80T>G polymorphism of SMN1 in intron 7 (also known as [id number]>G). This puts her at increased risk (1 in 2) to be a silent 2+0 carrier for spinal muscular atrophy (SMA). SMA is a condition caused by mutations in the SMN1 gene. SMA is characterized by progressive muscle weakness and atrophy due to degeneration and loss of anterior horn cells (lower motor neurons) in the spinal cord and brain stem. We discussed the different types of SMA (0, I, II, and III), including differences in severity and age of onset. We also reviewed the autosomal recessive inheritance pattern of SMA. Based on the carrier frequency for SMA in the African American population, Ms. Shukla partner currently has a 1 in 58 chance of being a carrier of SMA. If he is found to have 2 copies of SMN1, his risk of being a carrier is reduced but not eliminated. If both parents are carriers of SMA, there is a 1 in 4 (25%) chance of having an affected fetus.  Ms. Hamil carrier screening was negative for the other 12 conditions screened. Thus, her risk to be a carrier for these additional conditions (listed separately in the laboratory report) has been reduced but not eliminated. We discussed that carrier testing for SMA via copy number analysis and carrier testing for alpha-thalassemia via CBC, hemoglobin electrophoresis, and ferritin analysis with reflex to molecular testing is recommended for Ms. Daluz's partner. Ms. Bergthold indicated that she is interested in pursuing carrier screening for SMA and alpha thalassemia for her partner.  We also reviewed that Ms. Noguera had Panorama NIPS  through [place] that was low-risk for fetal aneuploidies. We reviewed that these results showed a less than 1 in 10,000 risk for trisomies 21, 18 and 13, and monosomy X (Turner syndrome).  In addition, the risk for triploidy and sex chromosome trisomies (47,XXX and 47,XXY) was also low. Ms. Bourget elected to have cffDNA analysis for 22q11 deletion syndrome, which was also low risk (1 in 9000). We reviewed that while this testing identifies > 99% of pregnancies with trisomy 56, trisomy 53, sex chromosome trisomies (47,XXX and 47,XXY), and triploidy, it is NOT diagnostic. A positive test result requires confirmation by CVS or amniocentesis, and a negative test result does not rule out a fetal chromosome abnormality. She also understands that this testing does not identify all genetic conditions.  A complete ultrasound was performed today prior to our visit. The ultrasound report will be sent under separate cover. There were no visualized fetal anomalies or markers suggestive of aneuploidy.  Ms. Dewalt was also counseled regarding diagnostic testing via amniocentesis. We discussed the technical aspects of the procedure and quoted up to a 1 in 500 (0.2%) risk for spontaneous pregnancy loss or other adverse pregnancy outcomes as a result of amniocentesis. Cultured cells from an amniocentesis sample allow for the visualization of a fetal karyotype, which can detect >99% of chromosomal aberrations. Chromosomal microarray can also be performed to identify smaller deletions or duplications of fetal chromosomal material. Amniocentesis could also be performed to assess whether the baby is affected by alpha-thalassemia and SMA. After careful consideration, Ms. Brester declined amniocentesis  at this time. She understands that amniocentesis is available at any point after 16 weeks of pregnancy and that she may opt to undergo the procedure at a later date should she change her mind.  Lastly, the patient was made aware that  screening for open neural tube defects (ONTDs) via MS-AFP in the second trimester in addition to level II ultrasound examination is recommended. We reviewed that Ms. Windhorst's level II ultrasound did not detect any ONTDs, and that level II ultrasound is able to detect them with 90-95% sensitivity. However, normal results from any of the above options do not guarantee a normal baby, as 3-5% of newborns have some type of birth defect, many of which are not prenatally diagnosable.  Ms. Kandis Nab desires carrier screening for alpha-thalssemia and SMA for her partner, Abbie Norris. Although he was waiting in the car at the time of her appointment, she did not think he brought a copy of his insurance card with him. For this reason, she preferred to have a saliva kit mailed to his house rather than having him return to get his blood drawn. Ms. Burgoon was provided with my business card and instructed to send me an email with a copy of her partner's insurance card. From there, I will facilitate the testing process. Results will take 2-3 weeks from the time of sample collection to be returned. I will call Ms. Chandley once results become available.  I counseled Ms. Nedd regarding the above risks and available options. The approximate face-to-face time with the genetic counselor was 55 minutes.  In summary:  Discussed carrier screening results and options for follow-up testing  Silent carrier for alpha-thalassemia  Increased risk to be silent carrier for SMA  Desires partner carrier screening. Will send me a copy of her partner's insurance card for me to facilitate testing via saliva kit. We will follow results  Reviewed low-risk NIPS results  Reduction in risk for Down syndrome, trisomy 27, trisomy 81, and sex chromosome aneuploidies  Reviewed results of ultrasound  No fetal anomalies or markers seen  Reduction in risk for fetal aneuploidy  Offered additional testing and screening  Declined  amniocentesis  Recommend MS-AFP screening  Reviewed family history concerns   Buelah Manis, MS Genetic Counselor

## 2018-10-29 ENCOUNTER — Other Ambulatory Visit: Payer: Self-pay

## 2018-10-29 ENCOUNTER — Ambulatory Visit (INDEPENDENT_AMBULATORY_CARE_PROVIDER_SITE_OTHER): Payer: Medicaid Other | Admitting: Family Medicine

## 2018-10-29 VITALS — BP 119/75 | HR 91 | Wt 253.0 lb

## 2018-10-29 DIAGNOSIS — M549 Dorsalgia, unspecified: Secondary | ICD-10-CM

## 2018-10-29 DIAGNOSIS — O9989 Other specified diseases and conditions complicating pregnancy, childbirth and the puerperium: Secondary | ICD-10-CM | POA: Diagnosis not present

## 2018-10-29 DIAGNOSIS — Z3480 Encounter for supervision of other normal pregnancy, unspecified trimester: Secondary | ICD-10-CM

## 2018-10-29 DIAGNOSIS — M9905 Segmental and somatic dysfunction of pelvic region: Secondary | ICD-10-CM | POA: Diagnosis not present

## 2018-10-29 DIAGNOSIS — M9903 Segmental and somatic dysfunction of lumbar region: Secondary | ICD-10-CM

## 2018-10-29 DIAGNOSIS — M9904 Segmental and somatic dysfunction of sacral region: Secondary | ICD-10-CM

## 2018-10-29 DIAGNOSIS — Z3A19 19 weeks gestation of pregnancy: Secondary | ICD-10-CM

## 2018-10-29 NOTE — Progress Notes (Signed)
   PRENATAL VISIT NOTE  Subjective:  Brandi Norris is a 23 y.o. [id number] at [redacted]w[redacted]d being seen today for ongoing prenatal care.  She is currently monitored for the following issues for this high-risk pregnancy and has Morbid obesity ([place]); Alpha thalassemia trait; Supervision of other normal pregnancy, antepartum; and History of gestational hypertension on their problem list.  Patient reports pain in right bck when she stands up. Pain goes down right leg adn it will sometimes give out on her. She's fallen a couple of times, including at MFM [place] appt. No weakness to leg or numbness. Had sciatica in last pregnancy..  Contractions: Not present. Vag. Bleeding: None.  Movement: Present. Denies leaking of fluid.   The following portions of the patient's history were reviewed and updated as appropriate: allergies, current medications, past family history, past medical history, past social history, past surgical history and problem list. Problem list updated.  Objective:   Vitals:   10/29/18 1007  BP: 119/75  Pulse: 91  Weight: 253 lb (114.8 kg)    Fetal Status: Fetal Heart Rate (bpm): 143   Movement: Present     General:  Alert, oriented and cooperative. Patient is in no acute distress.  Skin: Skin is warm and dry. No rash noted.   Cardiovascular: Normal heart rate noted  Respiratory: Normal respiratory effort, no problems with respiration noted  Abdomen: Soft, gravid, appropriate for gestational age. Pain/Pressure: Present     Pelvic:  Cervical exam deferred        MSK: Restriction, tenderness, tissue texture changes, and paraspinal spasm in the right lumbar spine  Neuro: Moves all four extremities with no focal neurological deficit  Extremities: Normal range of motion.  Edema: None  Mental Status: Normal mood and affect. Normal behavior. Normal judgment and thought content.   OSE: Head   Cervical   Thoracic   Rib   Lumbar L5 ESRL, L4 ESRR  Sacrum L/L  Pelvis Right ant innom     Assessment and Plan:  Pregnancy: G4P1021 at [redacted]w[redacted]d  1. Supervision of other normal pregnancy, antepartum FHT and FH normal - AFP, Serum, Open Spina Bifida  2. Back pain in pregnancy 3. Somatic dysfunction of lumbar region 4. Somatic dysfunction of sacral region 5. Somatic dysfunction of pelvis region OMT done after patient permission. HVLA technique utilized. 3 areas treated with improvement of tissue texture and joint mobility. Patient tolerated procedure well.   Preterm labor symptoms and general obstetric precautions including but not limited to vaginal bleeding, contractions, leaking of fluid and fetal movement were reviewed in detail with the patient. Please refer to After Visit Summary for other counseling recommendations.  Return in about 4 weeks (around 11/26/2018) for OB f/u, In Office.  Truett Mainland, DO

## 2018-10-31 LAB — AFP, SERUM, OPEN SPINA BIFIDA
AFP MoM: 1.03
AFP Value: 43.8 ng/mL
Gest. Age on Collection Date: 19.6 weeks
Maternal Age At EDD: 25 yr
OSBR Risk 1 IN: 10000
Test Results:: NEGATIVE
Weight: 253 [lb_av]

## 2018-11-02 ENCOUNTER — Telehealth: Payer: Self-pay

## 2018-11-02 NOTE — Telephone Encounter (Signed)
Pt called the office stating she has been having some pressure and braxton hicks contractions this morning. Pt states she experienced some tightening in her stomach. Pt advised to take some tylenol for the pain and to drink water and lie down on her left side to see that provide some comfort. Understanding was voiced. Jeet Shough l Meka Lewan, CMA

## 2018-11-12 ENCOUNTER — Encounter: Payer: Medicaid Other | Admitting: Family Medicine

## 2018-11-12 ENCOUNTER — Telehealth (HOSPITAL_COMMUNITY): Payer: Self-pay | Admitting: Genetic Counselor

## 2018-11-12 NOTE — Telephone Encounter (Signed)
Called Ms. Walpole to check in on partner carrier screening for spinal muscular atrophy (SMA) and alpha-thalassemia. Ms. Paternostro indicated that she would like her partner to undergo carrier screening for SMA and alpha-thalassemia during our genetic counseling session on 9/10. At that time, she requested that a saliva kit be mailed to their house; however, I needed a photo of her partner's insurance card to place the order and I had not yet received that. Ms. Sirek indicated that she is still interested in pursuing partner screening but forgot as life got busy for her. She will send me an email with a photo of her partner's insurance card tonight when he gets home from work. From there, I will place the order and have a saliva kit be mailed to their house. Results will take 2-3 weeks to be returned from the time the laboratory receives her partner's sample. I will call them when results become available.  Buelah Manis, MS Genetic Counselor

## 2018-11-17 ENCOUNTER — Telehealth: Payer: Self-pay

## 2018-11-17 MED ORDER — HYDROXYZINE HCL 25 MG PO TABS: 25.0000 mg | ORAL_TABLET | Freq: Four times a day (QID) | ORAL | 2 refills | Status: DC | PRN

## 2018-11-17 NOTE — Telephone Encounter (Signed)
Patient called stating that she is starting to itch really bad at night. Patient states its like what she had with her last pregnancy. Patient wondering if she could have the same medication for the itching as she had last time.  Will route to provider. Kathrene Alu RN

## 2018-11-17 NOTE — Telephone Encounter (Signed)
Can you have her come in for bile acids and CMP? I sent vistaril to the pharmacy for itching.

## 2018-11-17 NOTE — Telephone Encounter (Signed)
Patient called and made aware that Dr. Nehemiah Settle has sent in vistaril for the itching to her pharmacy. Patient is willing to have labwork done but would like to do when she comes to appt next week. Agreed to this and made note on appt date. Kathrene Alu RN

## 2018-11-23 ENCOUNTER — Telehealth: Payer: Self-pay

## 2018-11-23 MED ORDER — MICONAZOLE NITRATE 2 % VA CREA: 1.0000 | TOPICAL_CREAM | Freq: Every day | VAGINAL | 1 refills | Status: DC

## 2018-11-23 NOTE — Telephone Encounter (Signed)
Patient calling stating she is having a fishy odor clear discharge that has increased in the last two days. Patient would like refill of monistat sent to her pharmacy. Kathrene Alu RN

## 2018-11-25 ENCOUNTER — Encounter (HOSPITAL_COMMUNITY): Payer: Self-pay

## 2018-11-25 ENCOUNTER — Other Ambulatory Visit: Payer: Self-pay

## 2018-11-25 ENCOUNTER — Ambulatory Visit (HOSPITAL_COMMUNITY)
Admission: RE | Admit: 2018-11-25 | Discharge: 2018-11-25 | Disposition: A | Discharge status: Home / Self Care | Payer: Medicaid Other | Source: Ambulatory Visit | Attending: Obstetrics and Gynecology | Admitting: Obstetrics and Gynecology

## 2018-11-25 ENCOUNTER — Ambulatory Visit (HOSPITAL_COMMUNITY): Payer: Medicaid Other | Admitting: *Deleted

## 2018-11-25 ENCOUNTER — Other Ambulatory Visit (HOSPITAL_COMMUNITY): Payer: Self-pay | Admitting: *Deleted

## 2018-11-25 DIAGNOSIS — Z362 Encounter for other antenatal screening follow-up: Secondary | ICD-10-CM | POA: Diagnosis present

## 2018-11-25 DIAGNOSIS — O99212 Obesity complicating pregnancy, second trimester: Secondary | ICD-10-CM

## 2018-11-25 DIAGNOSIS — Z148 Genetic carrier of other disease: Secondary | ICD-10-CM

## 2018-11-25 DIAGNOSIS — Z3A23 23 weeks gestation of pregnancy: Secondary | ICD-10-CM

## 2018-11-25 DIAGNOSIS — O35EXX0 Maternal care for other (suspected) fetal abnormality and damage, fetal genitourinary anomalies, not applicable or unspecified: Secondary | ICD-10-CM

## 2018-11-25 DIAGNOSIS — Z3480 Encounter for supervision of other normal pregnancy, unspecified trimester: Secondary | ICD-10-CM

## 2018-11-25 DIAGNOSIS — O358XX0 Maternal care for other (suspected) fetal abnormality and damage, not applicable or unspecified: Secondary | ICD-10-CM

## 2018-11-25 DIAGNOSIS — O09292 Supervision of pregnancy with other poor reproductive or obstetric history, second trimester: Secondary | ICD-10-CM | POA: Diagnosis not present

## 2018-11-26 ENCOUNTER — Ambulatory Visit (INDEPENDENT_AMBULATORY_CARE_PROVIDER_SITE_OTHER): Payer: Medicaid Other | Admitting: Family Medicine

## 2018-11-26 ENCOUNTER — Other Ambulatory Visit (HOSPITAL_COMMUNITY)
Admission: RE | Admit: 2018-11-26 | Discharge: 2018-11-26 | Disposition: A | Discharge status: Home / Self Care | Payer: Medicaid Other | Source: Ambulatory Visit | Attending: Family Medicine | Admitting: Family Medicine

## 2018-11-26 VITALS — BP 111/68 | HR 86 | Wt 253.0 lb

## 2018-11-26 DIAGNOSIS — D563 Thalassemia minor: Secondary | ICD-10-CM

## 2018-11-26 DIAGNOSIS — Z8759 Personal history of other complications of pregnancy, childbirth and the puerperium: Secondary | ICD-10-CM

## 2018-11-26 DIAGNOSIS — N898 Other specified noninflammatory disorders of vagina: Secondary | ICD-10-CM

## 2018-11-26 DIAGNOSIS — Z3482 Encounter for supervision of other normal pregnancy, second trimester: Secondary | ICD-10-CM

## 2018-11-26 DIAGNOSIS — L299 Pruritus, unspecified: Secondary | ICD-10-CM

## 2018-11-26 DIAGNOSIS — Z3480 Encounter for supervision of other normal pregnancy, unspecified trimester: Secondary | ICD-10-CM

## 2018-11-26 DIAGNOSIS — Z3A23 23 weeks gestation of pregnancy: Secondary | ICD-10-CM

## 2018-11-26 NOTE — Progress Notes (Signed)
   PRENATAL VISIT NOTE  Subjective:  Brandi Norris is a 23 y.o. [id number] at [redacted]w[redacted]d being seen today for ongoing prenatal care.  She is currently monitored for the following issues for this high-risk pregnancy and has Morbid obesity ([place]); Alpha thalassemia trait; Supervision of other normal pregnancy, antepartum; and History of gestational hypertension on their problem list.  Patient reports no complaints.  Contractions: Not present. Vag. Bleeding: None.  Movement: Present. Denies leaking of fluid.   The following portions of the patient's history were reviewed and updated as appropriate: allergies, current medications, past family history, past medical history, past social history, past surgical history and problem list.   Objective:   Vitals:   11/26/18 0954  BP: 111/68  Pulse: 86  Weight: 253 lb (114.8 kg)    Fetal Status: Fetal Heart Rate (bpm): 155   Movement: Present     General:  Alert, oriented and cooperative. Patient is in no acute distress.  Skin: Skin is warm and dry. No rash noted.   Cardiovascular: Normal heart rate noted  Respiratory: Normal respiratory effort, no problems with respiration noted  Abdomen: Soft, gravid, appropriate for gestational age.  Pain/Pressure: Present     Pelvic: Cervical exam deferred        Extremities: Normal range of motion.  Edema: None  Mental Status: Normal mood and affect. Normal behavior. Normal judgment and thought content.   Assessment and Plan:  Pregnancy: G4P1021 at [redacted]w[redacted]d 1. Supervision of other normal pregnancy, antepartum FHT normal Has repeat US - left UTD. Will follow [place]  2. Alpha thalassemia trait  3. History of gestational hypertension ASA 81mg  daily recommended. Patient not consistent in taking  4. Itching Improved with vistaril.  Hasn't done labs yet CMP, bile acid  5. Vaginal irritation Wet prep  Preterm labor symptoms and general obstetric precautions including but not limited to vaginal bleeding,  contractions, leaking of fluid and fetal movement were reviewed in detail with the patient. Please refer to After Visit Summary for other counseling recommendations.   No follow-ups on file.  Future Appointments  Date Time Provider [place]  12/23/2018  8:00 AM [facility]  12/23/2018  8:00 AM [facility] [place] 3 [facility]    Maximillian Habibi J Michaele Amundson, DO

## 2018-11-28 LAB — COMPREHENSIVE METABOLIC PANEL
ALT: 9 IU/L (ref 0–32)
AST: 14 IU/L (ref 0–40)
Albumin/Globulin Ratio: 1.5 (ref 1.2–2.2)
Albumin: 4.3 g/dL (ref 3.9–5.0)
Alkaline Phosphatase: 75 IU/L (ref 39–117)
BUN/Creatinine Ratio: 6 — ABNORMAL LOW (ref 9–23)
BUN: 4 mg/dL — ABNORMAL LOW (ref 6–20)
Bilirubin Total: 0.3 mg/dL (ref 0.0–1.2)
CO2: 21 mmol/L (ref 20–29)
Calcium: 9.2 mg/dL (ref 8.7–10.2)
Chloride: 104 mmol/L (ref 96–106)
Creatinine, Ser: 0.71 mg/dL (ref 0.57–1.00)
GFR calc Af Amer: 138 mL/min/{1.73_m2} (ref >59)
GFR calc non Af Amer: 120 mL/min/{1.73_m2} (ref >59)
Globulin, Total: 2.9 g/dL (ref 1.5–4.5)
Glucose: 81 mg/dL (ref 65–99)
Potassium: 4.1 mmol/L (ref 3.5–5.2)
Sodium: 138 mmol/L (ref 134–144)
Total Protein: 7.2 g/dL (ref 6.0–8.5)

## 2018-11-28 LAB — BILE ACIDS, TOTAL: Bile Acids Total: 1.4 umol/L (ref 0.0–10.0)

## 2018-11-30 ENCOUNTER — Telehealth: Payer: Self-pay

## 2018-11-30 NOTE — Telephone Encounter (Signed)
Pt called requesting [place] results. Pt made aware that results will be discussed once [place] is reviewed by the provider. Understanding was voiced. Daniell Paradise l Delilah Mulgrew, CMA

## 2018-12-01 ENCOUNTER — Ambulatory Visit (INDEPENDENT_AMBULATORY_CARE_PROVIDER_SITE_OTHER): Payer: Medicaid Other | Admitting: Obstetrics & Gynecology

## 2018-12-01 ENCOUNTER — Encounter: Payer: Self-pay | Admitting: Obstetrics & Gynecology

## 2018-12-01 ENCOUNTER — Other Ambulatory Visit: Payer: Self-pay

## 2018-12-01 DIAGNOSIS — O99212 Obesity complicating pregnancy, second trimester: Secondary | ICD-10-CM

## 2018-12-01 DIAGNOSIS — Z3480 Encounter for supervision of other normal pregnancy, unspecified trimester: Secondary | ICD-10-CM

## 2018-12-01 DIAGNOSIS — Z3A24 24 weeks gestation of pregnancy: Secondary | ICD-10-CM

## 2018-12-01 DIAGNOSIS — D563 Thalassemia minor: Secondary | ICD-10-CM

## 2018-12-01 DIAGNOSIS — O99112 Other diseases of the blood and blood-forming organs and certain disorders involving the immune mechanism complicating pregnancy, second trimester: Secondary | ICD-10-CM

## 2018-12-01 DIAGNOSIS — Z8759 Personal history of other complications of pregnancy, childbirth and the puerperium: Secondary | ICD-10-CM

## 2018-12-01 NOTE — Progress Notes (Signed)
TELEHEALTH OBSTETRICS PRENATAL VIRTUAL VIDEO VISIT ENCOUNTER NOTE  Provider location: [facility]   I connected with Brandi Norris on 12/01/18 at  3:45 PM EDT by WebExt Video Encounter at home and verified that I am speaking with the correct person using two identifiers.   I discussed the limitations, risks, security and privacy concerns of performing an evaluation and management service virtually and the availability of in person appointments. I also discussed with the patient that there may be a patient responsible charge related to this service. The patient expressed understanding and agreed to proceed. Subjective:  Brandi Norris is a 23 y.o. [id number] at [redacted]w[redacted]d being seen today for ongoing prenatal care.  She is currently monitored for the following issues for this high-risk pregnancy and has Morbid obesity (HCC); Alpha thalassemia trait; Supervision of other normal pregnancy, antepartum; and History of gestational hypertension on their problem list.  Patient reports no complaints.  Contractions: Not present. Vag. Bleeding: None.  Movement: Present. Denies any leaking of fluid.   The following portions of the patient's history were reviewed and updated as appropriate: allergies, current medications, past family history, past medical history, past social history, past surgical history and problem list.   Objective:  There were no vitals filed for this visit. BP: l;ast checked 121/77 P 95  Fetal Status:     Movement: Present     General:  Alert, oriented and cooperative. Patient is in no acute distress.  Respiratory: Normal respiratory effort, no problems with respiration noted  Mental Status: Normal mood and affect. Normal behavior. Normal judgment and thought content.  Rest of physical exam deferred due to type of encounter  Imaging: [place] Mfm Ob Follow Up  Result Date: 11/25/2018  ----------------------------------------------------------------------  OBSTETRICS REPORT                       (Signed Final 11/25/2018 10:54 am) ---------------------------------------------------------------------- Patient Info  ID #:       [id number]                          D.O.B.:  27-Jun-1995 (23 yrs)  Name:       Brandi Norris                  Visit Date: 11/25/2018 10:20 am ---------------------------------------------------------------------- Performed By  Performed By:     Marcellina Millin          Ref. Address:     [address]                    [address]                                                             [address]  Attending:        Noralee Space MD        [facility]  [facility] ---------------------------------------------------------------------- Orders   #  Description                          Code         Ordered By   1  US MFM OB FOLLOW UP                  [phone]     [facility]  ----------------------------------------------------------------------   #  Order #                    Accession #                 Episode #   1  [id number]                  [id number]                  [id number]  ---------------------------------------------------------------------- Indications   Antenatal follow-up for nonvisualized fetal    Z36.2   anatomy   [redacted] weeks gestation of pregnancy (low risk      Z3A.23   NIPS)   Silent carrier for Alpha-Thalassemia           Z14.8   Increased carrier risk for SMA                 Z14.8   Poor obstetric history: Previous               O09.299   preeclampsia / eclampsia/gestational HTN   Maternal morbid obesity (Pre- pregnancy        O99.210 E66.01   BMI 46)  ---------------------------------------------------------------------- Fetal Evaluation  Num Of Fetuses:         1  Fetal Heart Rate(bpm):  138  Cardiac Activity:       Observed  Presentation:            Cephalic  Placenta:               Anterior  P. Cord Insertion:      Visualized  Amniotic Fluid  AFI FV:      Within normal limits                              Largest Pocket(cm)                              6.92 ---------------------------------------------------------------------- Biometry  BPD:      59.8  mm     G. Age:  24w 3d         69  %    CI:        77.65   %    70 - 86                                                          FL/HC:      19.2   %    18.7 - 20.9  HC:      214.8  mm     G. Age:  23w 4d  26  %    HC/AC:      1.05        1.05 - 1.21  AC:      204.6  mm     G. Age:  25w 0d         81  %    FL/BPD:     68.9   %    71 - 87  FL:       41.2  mm     G. Age:  23w 3d         27  %    FL/AC:      20.1   %    20 - 24  Est. FW:     677  gm      1 lb 8 oz     68  % ---------------------------------------------------------------------- OB History  Gravidity:    4         Term:   1        Prem:   0        SAB:   2  Living:       1 ---------------------------------------------------------------------- Gestational Age  LMP:           23w 5d        Date:  06/12/18                 EDD:   03/19/19  U/S Today:     24w 1d                                        EDD:   03/16/19  Best:          23w 5d     Det. By:  LMP  (06/12/18)          EDD:   03/19/19 ---------------------------------------------------------------------- Anatomy  Cranium:               Appears normal         Aortic Arch:            Appears normal  Cavum:                 Appears normal         Ductal Arch:            Appears normal  Ventricles:            Appears normal         Diaphragm:              Appears normal  Choroid Plexus:        Previously seen        Stomach:                Appears normal, left                                                                        sided  Cerebellum:            Appears normal         Abdomen:  Appears normal  Posterior Fossa:       Previously seen        Abdominal Wall:          Previously seen  Nuchal Fold:           Previously seen        Cord Vessels:           Previously seen  Face:                  Orbits and profile     Kidneys:                Left UTD  6mm                         previously seen  Lips:                  Appears normal         Bladder:                Appears normal  Thoracic:              Appears normal         Spine:                  Appears normal  Heart:                 Appears normal         Upper Extremities:      Previously seen                         (4CH, axis, and                         situs)  RVOT:                  Appears normal         Lower Extremities:      Previously seen  LVOT:                  Appears normal  Other:  Female gender. Nasal bone previously visualized. Heels visualized.          Technically difficult due to maternal habitus and fetal position. ---------------------------------------------------------------------- Cervix Uterus Adnexa  Cervix  Length:            3.5  cm.  Normal appearance by transabdominal scan. ---------------------------------------------------------------------- Impression  Patient return for completion of fetal anatomy.  Fetal growth is appropriate for gestational age.  Amniotic fluid  is normal good fetal activity seen.  Fetal spine appears  normal.  Left urinary tract dilation measuring 6 mm is seen.  Both kidneys otherwise look normal with no increased  echogenicities.  On cell free fetal DNA screening the risk for Down syndrome  was not increased.  I explained the finding of UTD and reassured her that in my  majority of cases they resolved in utero or after birth and only  rarely associated with obstructive uropathy.  Maternal obesity imposes limitations on the resolution of  images, and failure to detect fetal anomalies is more common  in obese pregnant women. As maternal obesity makes  clinical assessment of fetal growth difficult, we recommend  serial growth scans until delivery.  ---------------------------------------------------------------------- Recommendations  -An appointment was made for her to return in 4 weeks  for  fetal growth assessment. ----------------------------------------------------------------------                  Noralee Space, MD Electronically Signed Final Report   11/25/2018 10:54 am ----------------------------------------------------------------------   Assessment and Plan:  Pregnancy: [id number] at [redacted]w[redacted]d 1. Supervision of other normal pregnancy, antepartum Good FM Has [place] scheudled  2. Morbid obesity (HCC) Recommended walking   3. History of gestational hypertension Has not started it Will begin it now.   4. Alpha thalassemia trait  Preterm labor symptoms and general obstetric precautions including but not limited to vaginal bleeding, contractions, leaking of fluid and fetal movement were reviewed in detail with the patient. I discussed the assessment and treatment plan with the patient. The patient was provided an opportunity to ask questions and all were answered. The patient agreed with the plan and demonstrated an understanding of the instructions. The patient was advised to call back or seek an in-person office evaluation/go to [facility] at [facility] for any urgent or concerning symptoms. Please refer to After Visit Summary for other counseling recommendations.   I provided 17 minutes of face-to-face time during this encounter.  No follow-ups on file.  Future Appointments  Date Time Provider Department Center  12/21/2018  8:15 AM Aviva Signs, CNM [facility] None  12/23/2018  8:00 AM [facility] NURSE [facility] MFC-US  12/23/2018  8:00 AM [facility] [place] 3 WH-MFCUS MFC-US    Willodean Rosenthal, MD Center for [employer], [facility]

## 2018-12-06 LAB — CERVICOVAGINAL ANCILLARY ONLY
Bacterial Vaginitis (gardnerella): NEGATIVE
Candida Glabrata: NEGATIVE
Candida Vaginitis: NEGATIVE
Comment: NEGATIVE
Comment: NEGATIVE
Comment: NEGATIVE

## 2018-12-08 ENCOUNTER — Telehealth: Payer: Self-pay

## 2018-12-08 NOTE — Telephone Encounter (Signed)
Called pt regarding results. Pt made aware that she her wet prep was negative. Pt states that she has sharp pain when in certain sexual positions. Pt Advised pt to try lying on her side and to use more pillows. Pt unable to come in to be seen because of her work schedule. Advised pt to call the office if the pain persists.  Understanding was voiced. chiquita l wilson, CMA

## 2018-12-21 ENCOUNTER — Encounter: Payer: Self-pay | Admitting: Advanced Practice Midwife

## 2018-12-21 ENCOUNTER — Ambulatory Visit (INDEPENDENT_AMBULATORY_CARE_PROVIDER_SITE_OTHER): Payer: Medicaid Other | Admitting: Advanced Practice Midwife

## 2018-12-21 ENCOUNTER — Other Ambulatory Visit: Payer: Self-pay

## 2018-12-21 VITALS — BP 109/64 | HR 94 | Wt 255.0 lb

## 2018-12-21 DIAGNOSIS — Z3482 Encounter for supervision of other normal pregnancy, second trimester: Secondary | ICD-10-CM

## 2018-12-21 DIAGNOSIS — Z23 Encounter for immunization: Secondary | ICD-10-CM | POA: Diagnosis not present

## 2018-12-21 DIAGNOSIS — Z3A27 27 weeks gestation of pregnancy: Secondary | ICD-10-CM

## 2018-12-21 DIAGNOSIS — Z3480 Encounter for supervision of other normal pregnancy, unspecified trimester: Secondary | ICD-10-CM

## 2018-12-21 NOTE — Patient Instructions (Signed)

## 2018-12-21 NOTE — Progress Notes (Addendum)
   PRENATAL VISIT NOTE  Subjective:  Brandi Norris is a 23 y.o. M3W4665 at [redacted]w[redacted]d being seen today for ongoing prenatal care.  She is currently monitored for the following issues for this high-risk pregnancy and has Morbid obesity ([place]); Alpha thalassemia trait; Supervision of other normal pregnancy, antepartum; and History of gestational hypertension on their problem list.  Patient reports intermittent aches and pains in hips on arising.  Contractions: Irritability. Vag. Bleeding: None.  Movement: Present. Denies leaking of fluid.   Admits she is still not taking ASA.  States can't remember  The following portions of the patient's history were reviewed and updated as appropriate: allergies, current medications, past family history, past medical history, past social history, past surgical history and problem list.   Objective:   Vitals:   12/21/18 0816  BP: 109/64  Pulse: 94  Weight: 115.7 kg    Fetal Status:     Movement: Present     General:  Alert, oriented and cooperative. Patient is in no acute distress.  Skin: Skin is warm and dry. No rash noted.   Cardiovascular: Normal heart rate noted  Respiratory: Normal respiratory effort, no problems with respiration noted  Abdomen: Soft, gravid, appropriate for gestational age.  Pain/Pressure: Present     Pelvic: Cervical exam deferred        Extremities: Normal range of motion.  Edema: None  Mental Status: Normal mood and affect. Normal behavior. Normal judgment and thought content.   Assessment and Plan:  Pregnancy: G4P1021 at [redacted]w[redacted]d 1. Supervision of other normal pregnancy, antepartum     Glucola today - CBC - Glucose Tolerance, 2 Hours w/1 Hour - HIV Antibody (routine testing w rflx) - RPR - Tdap vaccine greater than or equal to 7yo IM  2.   Discussed labor     Wants to be able to labor in water even if she cannot have her waterbirth      Discussed right not we are not utilizing tubs      Wants liberal positioning for  delivery      Prefers not to be induced  3.   Hx GHTN     Discussed value of taking ASA in pregnancy     Discussed it is not too late to start      Tips on helping her remember reviewed, including putting next to PNV bottle which she does take daily.   Preterm labor symptoms and general obstetric precautions including but not limited to vaginal bleeding, contractions, leaking of fluid and fetal movement were reviewed in detail with the patient. Please refer to After Visit Summary for other counseling recommendations.   Return in about 2 weeks (around 01/04/2019) for [employer].  Future Appointments  Date Time Provider [place]  12/23/2018  8:00 AM [place] NURSE [place] [facility]  12/23/2018  8:00 AM [place] 3 WH-MFCUS MFC-US    Hansel Feinstein, CNM

## 2018-12-22 LAB — CBC
Hematocrit: 32.3 % — ABNORMAL LOW (ref 34.0–46.6)
Hemoglobin: 10.5 g/dL — ABNORMAL LOW (ref 11.1–15.9)
MCH: 25 pg — ABNORMAL LOW (ref 26.6–33.0)
MCHC: 32.5 g/dL (ref 31.5–35.7)
MCV: 77 fL — ABNORMAL LOW (ref 79–97)
Platelets: 294 10*3/uL (ref 150–450)
RBC: 4.2 x10E6/uL (ref 3.77–5.28)
RDW: 13.7 % (ref 11.7–15.4)
WBC: 10 10*3/uL (ref 3.4–10.8)

## 2018-12-22 LAB — RPR: RPR Ser Ql: NONREACTIVE

## 2018-12-22 LAB — GLUCOSE TOLERANCE, 2 HOURS W/ 1HR
Glucose, 1 hour: 107 mg/dL (ref 65–179)
Glucose, 2 hour: 115 mg/dL (ref 65–152)
Glucose, Fasting: 80 mg/dL (ref 65–91)

## 2018-12-22 LAB — HIV ANTIBODY (ROUTINE TESTING W REFLEX): HIV Screen 4th Generation wRfx: NONREACTIVE

## 2018-12-23 ENCOUNTER — Ambulatory Visit (HOSPITAL_COMMUNITY)
Admission: RE | Admit: 2018-12-23 | Discharge: 2018-12-23 | Disposition: A | Discharge status: Home / Self Care | Payer: Medicaid Other | Source: Ambulatory Visit | Attending: Obstetrics and Gynecology | Admitting: Obstetrics and Gynecology

## 2018-12-23 ENCOUNTER — Encounter (HOSPITAL_COMMUNITY): Payer: Self-pay

## 2018-12-23 ENCOUNTER — Other Ambulatory Visit (HOSPITAL_COMMUNITY): Payer: Self-pay | Admitting: *Deleted

## 2018-12-23 ENCOUNTER — Other Ambulatory Visit: Payer: Self-pay

## 2018-12-23 ENCOUNTER — Ambulatory Visit (HOSPITAL_COMMUNITY): Payer: Medicaid Other | Admitting: *Deleted

## 2018-12-23 DIAGNOSIS — Z3480 Encounter for supervision of other normal pregnancy, unspecified trimester: Secondary | ICD-10-CM | POA: Diagnosis present

## 2018-12-23 DIAGNOSIS — O358XX0 Maternal care for other (suspected) fetal abnormality and damage, not applicable or unspecified: Secondary | ICD-10-CM

## 2018-12-23 DIAGNOSIS — O35EXX0 Maternal care for other (suspected) fetal abnormality and damage, fetal genitourinary anomalies, not applicable or unspecified: Secondary | ICD-10-CM

## 2018-12-23 DIAGNOSIS — O09299 Supervision of pregnancy with other poor reproductive or obstetric history, unspecified trimester: Secondary | ICD-10-CM | POA: Diagnosis not present

## 2018-12-23 DIAGNOSIS — O99210 Obesity complicating pregnancy, unspecified trimester: Secondary | ICD-10-CM | POA: Diagnosis not present

## 2018-12-23 DIAGNOSIS — Z362 Encounter for other antenatal screening follow-up: Secondary | ICD-10-CM | POA: Diagnosis not present

## 2018-12-23 DIAGNOSIS — Z3A27 27 weeks gestation of pregnancy: Secondary | ICD-10-CM

## 2019-01-06 ENCOUNTER — Other Ambulatory Visit: Payer: Self-pay

## 2019-01-06 ENCOUNTER — Ambulatory Visit (INDEPENDENT_AMBULATORY_CARE_PROVIDER_SITE_OTHER): Payer: Medicaid Other | Admitting: Obstetrics & Gynecology

## 2019-01-06 VITALS — BP 128/81 | HR 96 | Wt 253.0 lb

## 2019-01-06 DIAGNOSIS — Z8759 Personal history of other complications of pregnancy, childbirth and the puerperium: Secondary | ICD-10-CM

## 2019-01-06 DIAGNOSIS — O99213 Obesity complicating pregnancy, third trimester: Secondary | ICD-10-CM

## 2019-01-06 DIAGNOSIS — Z3480 Encounter for supervision of other normal pregnancy, unspecified trimester: Secondary | ICD-10-CM

## 2019-01-06 DIAGNOSIS — F5089 Other specified eating disorder: Secondary | ICD-10-CM

## 2019-01-06 DIAGNOSIS — Z23 Encounter for immunization: Secondary | ICD-10-CM

## 2019-01-06 DIAGNOSIS — Z3A29 29 weeks gestation of pregnancy: Secondary | ICD-10-CM

## 2019-01-06 DIAGNOSIS — D563 Thalassemia minor: Secondary | ICD-10-CM

## 2019-01-06 NOTE — Progress Notes (Signed)
   PRENATAL VISIT NOTE  Subjective:  Brandi Norris is a 23 y.o. [id number] at [redacted]w[redacted]d being seen today for ongoing prenatal care.  She is currently monitored for the following issues for this low-risk pregnancy and has Morbid obesity ([place]); Alpha thalassemia trait; Supervision of other normal pregnancy, antepartum; and History of gestational hypertension on their problem list.  Patient reports occasional contractions.  Contractions: Not present. Vag. Bleeding: None.  Movement: Present. Denies leaking of fluid.   The following portions of the patient's history were reviewed and updated as appropriate: allergies, current medications, past family history, past medical history, past social history, past surgical history and problem list.   Objective:   Vitals:   01/06/19 1434  BP: 128/81  Pulse: 96  Weight: 253 lb (114.8 kg)    Fetal Status:     Movement: Present     General:  Alert, oriented and cooperative. Patient is in no acute distress.  Skin: Skin is warm and dry. No rash noted.   Cardiovascular: Normal heart rate noted  Respiratory: Normal respiratory effort, no problems with respiration noted  Abdomen: Soft, gravid, appropriate for gestational age.  Pain/Pressure: Present     Pelvic: Cervical exam deferred        Extremities: Normal range of motion.  Edema: None  Mental Status: Normal mood and affect. Normal behavior. Normal judgment and thought content.   Assessment and Plan:  Pregnancy: G4P1021 at [redacted]w[redacted]d 1. Supervision of other normal pregnancy, antepartum Good FM Flu shot today  2. Morbid obesity ([place]) 12/23/2018  Est. FW:    1197  gm    2 lb 10 oz      59  %  3. History of gestational hypertension On baby ASA  4. Alpha thalassemia trait  5. Anemia ante Begin FeSO4.  Pt is eating conrstarch  Preterm labor symptoms and general obstetric precautions including but not limited to vaginal bleeding, contractions, leaking of fluid and fetal movement were reviewed in detail  with the patient. Please refer to After Visit Summary for other counseling recommendations.   Return in about 2 weeks (around 01/20/2019).  Future Appointments  Date Time Provider [place]  01/27/2019  9:15 AM [place] MFC-US  01/27/2019  9:15 AM [place] 4 WH-MFCUS MFC-US  01/27/2019  1:00 PM Truett Mainland, DO [facility] None    Lavonia Drafts, MD

## 2019-01-06 NOTE — Patient Instructions (Signed)
Third Trimester of Pregnancy The third trimester is from week 28 through week 40 (months 7 through 9). The third trimester is a time when the unborn baby (fetus) is growing rapidly. At the end of the ninth month, the fetus is about 20 inches in length and weighs 6-10 pounds. Body changes during your third trimester Your body will continue to go through many changes during pregnancy. The changes vary from woman to woman. During the third trimester:  Your weight will continue to increase. You can expect to gain 25-35 pounds (11-16 kg) by the end of the pregnancy.  You may begin to get stretch marks on your hips, abdomen, and breasts.  You may urinate more often because the fetus is moving lower into your pelvis and pressing on your bladder.  You may develop or continue to have heartburn. This is caused by increased hormones that slow down muscles in the digestive tract.  You may develop or continue to have constipation because increased hormones slow digestion and cause the muscles that push waste through your intestines to relax.  You may develop hemorrhoids. These are swollen veins (varicose veins) in the rectum that can itch or be painful.  You may develop swollen, bulging veins (varicose veins) in your legs.  You may have increased body aches in the pelvis, back, or thighs. This is due to weight gain and increased hormones that are relaxing your joints.  You may have changes in your hair. These can include thickening of your hair, rapid growth, and changes in texture. Some women also have hair loss during or after pregnancy, or hair that feels dry or thin. Your hair will most likely return to normal after your baby is born.  Your breasts will continue to grow and they will continue to become tender. A yellow fluid (colostrum) may leak from your breasts. This is the first milk you are producing for your baby.  Your belly button may stick out.  You may notice more swelling in your hands,  face, or ankles.  You may have increased tingling or numbness in your hands, arms, and legs. The skin on your belly may also feel numb.  You may feel short of breath because of your expanding uterus.  You may have more problems sleeping. This can be caused by the size of your belly, increased need to urinate, and an increase in your body's metabolism.  You may notice the fetus "dropping," or moving lower in your abdomen (lightening).  You may have increased vaginal discharge.  You may notice your joints feel loose and you may have pain around your pelvic bone. What to expect at prenatal visits You will have prenatal exams every 2 weeks until week 36. Then you will have weekly prenatal exams. During a routine prenatal visit:  You will be weighed to make sure you and the baby are growing normally.  Your blood pressure will be taken.  Your abdomen will be measured to track your baby's growth.  The fetal heartbeat will be listened to.  Any test results from the previous visit will be discussed.  You may have a cervical check near your due date to see if your cervix has softened or thinned (effaced).  You will be tested for Group B streptococcus. This happens between 35 and 37 weeks. Your health care provider may ask you:  What your birth plan is.  How you are feeling.  If you are feeling the baby move.  If you have had any abnormal   symptoms, such as leaking fluid, bleeding, severe headaches, or abdominal cramping.  If you are using any tobacco products, including cigarettes, chewing tobacco, and electronic cigarettes.  If you have any questions. Other tests or screenings that may be performed during your third trimester include:  Blood tests that check for low iron levels (anemia).  Fetal testing to check the health, activity level, and growth of the fetus. Testing is done if you have certain medical conditions or if there are problems during the pregnancy.  Nonstress test  (NST). This test checks the health of your baby to make sure there are no signs of problems, such as the baby not getting enough oxygen. During this test, a belt is placed around your belly. The baby is made to move, and its heart rate is monitored during movement. What is false labor? False labor is a condition in which you feel small, irregular tightenings of the muscles in the womb (contractions) that usually go away with rest, changing position, or drinking water. These are called Braxton Hicks contractions. Contractions may last for hours, days, or even weeks before true labor sets in. If contractions come at regular intervals, become more frequent, increase in intensity, or become painful, you should see your health care provider. What are the signs of labor?  Abdominal cramps.  Regular contractions that start at 10 minutes apart and become stronger and more frequent with time.  Contractions that start on the top of the uterus and spread down to the lower abdomen and back.  Increased pelvic pressure and dull back pain.  A watery or bloody mucus discharge that comes from the vagina.  Leaking of amniotic fluid. This is also known as your "water breaking." It could be a slow trickle or a gush. Let your health care provider know if it has a color or strange odor. If you have any of these signs, call your health care provider right away, even if it is before your due date. Follow these instructions at home: Medicines  Follow your health care provider's instructions regarding medicine use. Specific medicines may be either safe or unsafe to take during pregnancy.  Take a prenatal vitamin that contains at least 600 micrograms (mcg) of folic acid.  If you develop constipation, try taking a stool softener if your health care provider approves. Eating and drinking   Eat a balanced diet that includes fresh fruits and vegetables, whole grains, good sources of protein such as meat, eggs, or tofu,  and low-fat dairy. Your health care provider will help you determine the amount of weight gain that is right for you.  Avoid raw meat and uncooked cheese. These carry germs that can cause birth defects in the baby.  If you have low calcium intake from food, talk to your health care provider about whether you should take a daily calcium supplement.  Eat four or five small meals rather than three large meals a day.  Limit foods that are high in fat and processed sugars, such as fried and sweet foods.  To prevent constipation: ? Drink enough fluid to keep your urine clear or pale yellow. ? Eat foods that are high in fiber, such as fresh fruits and vegetables, whole grains, and beans. Activity  Exercise only as directed by your health care provider. Most women can continue their usual exercise routine during pregnancy. Try to exercise for 30 minutes at least 5 days a week. Stop exercising if you experience uterine contractions.  Avoid heavy lifting.  Do   not exercise in extreme heat or humidity, or at high altitudes.  Wear low-heel, comfortable shoes.  Practice good posture.  You may continue to have sex unless your health care provider tells you otherwise. Relieving pain and discomfort  Take frequent breaks and rest with your legs elevated if you have leg cramps or low back pain.  Take warm sitz baths to soothe any pain or discomfort caused by hemorrhoids. Use hemorrhoid cream if your health care provider approves.  Wear a good support bra to prevent discomfort from breast tenderness.  If you develop varicose veins: ? Wear support pantyhose or compression stockings as told by your healthcare provider. ? Elevate your feet for 15 minutes, 3-4 times a day. Prenatal care  Write down your questions. Take them to your prenatal visits.  Keep all your prenatal visits as told by your health care provider. This is important. Safety  Wear your seat belt at all times when driving.  Make  a list of emergency phone numbers, including numbers for family, friends, the hospital, and police and fire departments. General instructions  Avoid cat litter boxes and soil used by cats. These carry germs that can cause birth defects in the baby. If you have a cat, ask someone to clean the litter box for you.  Do not travel far distances unless it is absolutely necessary and only with the approval of your health care provider.  Do not use hot tubs, steam rooms, or saunas.  Do not drink alcohol.  Do not use any products that contain nicotine or tobacco, such as cigarettes and e-cigarettes. If you need help quitting, ask your health care provider.  Do not use any medicinal herbs or unprescribed drugs. These chemicals affect the formation and growth of the baby.  Do not douche or use tampons or scented sanitary pads.  Do not cross your legs for long periods of time.  To prepare for the arrival of your baby: ? Take prenatal classes to understand, practice, and ask questions about labor and delivery. ? Make a trial run to the hospital. ? Visit the hospital and tour the maternity area. ? Arrange for maternity or paternity leave through employers. ? Arrange for family and friends to take care of pets while you are in the hospital. ? Purchase a rear-facing car seat and make sure you know how to install it in your car. ? Pack your hospital bag. ? Prepare the baby's nursery. Make sure to remove all pillows and stuffed animals from the baby's crib to prevent suffocation.  Visit your dentist if you have not gone during your pregnancy. Use a soft toothbrush to brush your teeth and be gentle when you floss. Contact a health care provider if:  You are unsure if you are in labor or if your water has broken.  You become dizzy.  You have mild pelvic cramps, pelvic pressure, or nagging pain in your abdominal area.  You have lower back pain.  You have persistent nausea, vomiting, or diarrhea.   You have an unusual or bad smelling vaginal discharge.  You have pain when you urinate. Get help right away if:  Your water breaks before 37 weeks.  You have regular contractions less than 5 minutes apart before 37 weeks.  You have a fever.  You are leaking fluid from your vagina.  You have spotting or bleeding from your vagina.  You have severe abdominal pain or cramping.  You have rapid weight loss or weight gain.  You have   shortness of breath with chest pain.  You notice sudden or extreme swelling of your face, hands, ankles, feet, or legs.  Your baby makes fewer than 10 movements in 2 hours.  You have severe headaches that do not go away when you take medicine.  You have vision changes. Summary  The third trimester is from week 28 through week 40, months 7 through 9. The third trimester is a time when the unborn baby (fetus) is growing rapidly.  During the third trimester, your discomfort may increase as you and your baby continue to gain weight. You may have abdominal, leg, and back pain, sleeping problems, and an increased need to urinate.  During the third trimester your breasts will keep growing and they will continue to become tender. A yellow fluid (colostrum) may leak from your breasts. This is the first milk you are producing for your baby.  False labor is a condition in which you feel small, irregular tightenings of the muscles in the womb (contractions) that eventually go away. These are called Braxton Hicks contractions. Contractions may last for hours, days, or even weeks before true labor sets in.  Signs of labor can include: abdominal cramps; regular contractions that start at 10 minutes apart and become stronger and more frequent with time; watery or bloody mucus discharge that comes from the vagina; increased pelvic pressure and dull back pain; and leaking of amniotic fluid. This information is not intended to replace advice given to you by your health  care provider. Make sure you discuss any questions you have with your health care provider. Document Released: 01/28/2001 Document Revised: 05/27/2018 Document Reviewed: 03/11/2016 Elsevier Patient Education  2020 [employer]  

## 2019-01-27 ENCOUNTER — Ambulatory Visit (HOSPITAL_COMMUNITY): Payer: Medicaid Other | Admitting: *Deleted

## 2019-01-27 ENCOUNTER — Other Ambulatory Visit: Payer: Self-pay

## 2019-01-27 ENCOUNTER — Encounter: Payer: Medicaid Other | Admitting: Obstetrics & Gynecology

## 2019-01-27 ENCOUNTER — Other Ambulatory Visit (HOSPITAL_COMMUNITY): Payer: Self-pay | Admitting: *Deleted

## 2019-01-27 ENCOUNTER — Ambulatory Visit (INDEPENDENT_AMBULATORY_CARE_PROVIDER_SITE_OTHER): Payer: Medicaid Other | Admitting: Obstetrics & Gynecology

## 2019-01-27 ENCOUNTER — Ambulatory Visit (HOSPITAL_COMMUNITY)
Admission: RE | Admit: 2019-01-27 | Discharge: 2019-01-27 | Disposition: A | Discharge status: Home / Self Care | Payer: Medicaid Other | Source: Ambulatory Visit | Attending: Obstetrics | Admitting: Obstetrics

## 2019-01-27 ENCOUNTER — Encounter (HOSPITAL_COMMUNITY): Payer: Self-pay

## 2019-01-27 VITALS — BP 119/77 | HR 90 | Wt 254.0 lb

## 2019-01-27 DIAGNOSIS — B3731 Acute candidiasis of vulva and vagina: Secondary | ICD-10-CM

## 2019-01-27 DIAGNOSIS — O99213 Obesity complicating pregnancy, third trimester: Secondary | ICD-10-CM

## 2019-01-27 DIAGNOSIS — O358XX0 Maternal care for other (suspected) fetal abnormality and damage, not applicable or unspecified: Secondary | ICD-10-CM | POA: Insufficient documentation

## 2019-01-27 DIAGNOSIS — Z3A32 32 weeks gestation of pregnancy: Secondary | ICD-10-CM | POA: Diagnosis not present

## 2019-01-27 DIAGNOSIS — Z362 Encounter for other antenatal screening follow-up: Secondary | ICD-10-CM | POA: Diagnosis not present

## 2019-01-27 DIAGNOSIS — Z3480 Encounter for supervision of other normal pregnancy, unspecified trimester: Secondary | ICD-10-CM | POA: Insufficient documentation

## 2019-01-27 DIAGNOSIS — Z3483 Encounter for supervision of other normal pregnancy, third trimester: Secondary | ICD-10-CM

## 2019-01-27 DIAGNOSIS — B373 Candidiasis of vulva and vagina: Secondary | ICD-10-CM

## 2019-01-27 DIAGNOSIS — Z8759 Personal history of other complications of pregnancy, childbirth and the puerperium: Secondary | ICD-10-CM

## 2019-01-27 DIAGNOSIS — D563 Thalassemia minor: Secondary | ICD-10-CM

## 2019-01-27 DIAGNOSIS — O35EXX0 Maternal care for other (suspected) fetal abnormality and damage, fetal genitourinary anomalies, not applicable or unspecified: Secondary | ICD-10-CM

## 2019-01-27 MED ORDER — TERCONAZOLE 0.4 % VA CREA: 1.0000 | TOPICAL_CREAM | Freq: Every day | VAGINAL | 0 refills | Status: DC

## 2019-01-27 NOTE — Patient Instructions (Signed)
Third Trimester of Pregnancy The third trimester is from week 28 through week 40 (months 7 through 9). The third trimester is a time when the unborn baby (fetus) is growing rapidly. At the end of the ninth month, the fetus is about 20 inches in length and weighs 6-10 pounds. Body changes during your third trimester Your body will continue to go through many changes during pregnancy. The changes vary from woman to woman. During the third trimester:  Your weight will continue to increase. You can expect to gain 25-35 pounds (11-16 kg) by the end of the pregnancy.  You may begin to get stretch marks on your hips, abdomen, and breasts.  You may urinate more often because the fetus is moving lower into your pelvis and pressing on your bladder.  You may develop or continue to have heartburn. This is caused by increased hormones that slow down muscles in the digestive tract.  You may develop or continue to have constipation because increased hormones slow digestion and cause the muscles that push waste through your intestines to relax.  You may develop hemorrhoids. These are swollen veins (varicose veins) in the rectum that can itch or be painful.  You may develop swollen, bulging veins (varicose veins) in your legs.  You may have increased body aches in the pelvis, back, or thighs. This is due to weight gain and increased hormones that are relaxing your joints.  You may have changes in your hair. These can include thickening of your hair, rapid growth, and changes in texture. Some women also have hair loss during or after pregnancy, or hair that feels dry or thin. Your hair will most likely return to normal after your baby is born.  Your breasts will continue to grow and they will continue to become tender. A yellow fluid (colostrum) may leak from your breasts. This is the first milk you are producing for your baby.  Your belly button may stick out.  You may notice more swelling in your hands,  face, or ankles.  You may have increased tingling or numbness in your hands, arms, and legs. The skin on your belly may also feel numb.  You may feel short of breath because of your expanding uterus.  You may have more problems sleeping. This can be caused by the size of your belly, increased need to urinate, and an increase in your body's metabolism.  You may notice the fetus "dropping," or moving lower in your abdomen (lightening).  You may have increased vaginal discharge.  You may notice your joints feel loose and you may have pain around your pelvic bone. What to expect at prenatal visits You will have prenatal exams every 2 weeks until week 36. Then you will have weekly prenatal exams. During a routine prenatal visit:  You will be weighed to make sure you and the baby are growing normally.  Your blood pressure will be taken.  Your abdomen will be measured to track your baby's growth.  The fetal heartbeat will be listened to.  Any test results from the previous visit will be discussed.  You may have a cervical check near your due date to see if your cervix has softened or thinned (effaced).  You will be tested for Group B streptococcus. This happens between 35 and 37 weeks. Your health care provider may ask you:  What your birth plan is.  How you are feeling.  If you are feeling the baby move.  If you have had any abnormal   symptoms, such as leaking fluid, bleeding, severe headaches, or abdominal cramping.  If you are using any tobacco products, including cigarettes, chewing tobacco, and electronic cigarettes.  If you have any questions. Other tests or screenings that may be performed during your third trimester include:  Blood tests that check for low iron levels (anemia).  Fetal testing to check the health, activity level, and growth of the fetus. Testing is done if you have certain medical conditions or if there are problems during the pregnancy.  Nonstress test  (NST). This test checks the health of your baby to make sure there are no signs of problems, such as the baby not getting enough oxygen. During this test, a belt is placed around your belly. The baby is made to move, and its heart rate is monitored during movement. What is false labor? False labor is a condition in which you feel small, irregular tightenings of the muscles in the womb (contractions) that usually go away with rest, changing position, or drinking water. These are called Braxton Hicks contractions. Contractions may last for hours, days, or even weeks before true labor sets in. If contractions come at regular intervals, become more frequent, increase in intensity, or become painful, you should see your health care provider. What are the signs of labor?  Abdominal cramps.  Regular contractions that start at 10 minutes apart and become stronger and more frequent with time.  Contractions that start on the top of the uterus and spread down to the lower abdomen and back.  Increased pelvic pressure and dull back pain.  A watery or bloody mucus discharge that comes from the vagina.  Leaking of amniotic fluid. This is also known as your "water breaking." It could be a slow trickle or a gush. Let your health care provider know if it has a color or strange odor. If you have any of these signs, call your health care provider right away, even if it is before your due date. Follow these instructions at home: Medicines  Follow your health care provider's instructions regarding medicine use. Specific medicines may be either safe or unsafe to take during pregnancy.  Take a prenatal vitamin that contains at least 600 micrograms (mcg) of folic acid.  If you develop constipation, try taking a stool softener if your health care provider approves. Eating and drinking   Eat a balanced diet that includes fresh fruits and vegetables, whole grains, good sources of protein such as meat, eggs, or tofu,  and low-fat dairy. Your health care provider will help you determine the amount of weight gain that is right for you.  Avoid raw meat and uncooked cheese. These carry germs that can cause birth defects in the baby.  If you have low calcium intake from food, talk to your health care provider about whether you should take a daily calcium supplement.  Eat four or five small meals rather than three large meals a day.  Limit foods that are high in fat and processed sugars, such as fried and sweet foods.  To prevent constipation: ? Drink enough fluid to keep your urine clear or pale yellow. ? Eat foods that are high in fiber, such as fresh fruits and vegetables, whole grains, and beans. Activity  Exercise only as directed by your health care provider. Most women can continue their usual exercise routine during pregnancy. Try to exercise for 30 minutes at least 5 days a week. Stop exercising if you experience uterine contractions.  Avoid heavy lifting.  Do   not exercise in extreme heat or humidity, or at high altitudes.  Wear low-heel, comfortable shoes.  Practice good posture.  You may continue to have sex unless your health care provider tells you otherwise. Relieving pain and discomfort  Take frequent breaks and rest with your legs elevated if you have leg cramps or low back pain.  Take warm sitz baths to soothe any pain or discomfort caused by hemorrhoids. Use hemorrhoid cream if your health care provider approves.  Wear a good support bra to prevent discomfort from breast tenderness.  If you develop varicose veins: ? Wear support pantyhose or compression stockings as told by your healthcare provider. ? Elevate your feet for 15 minutes, 3-4 times a day. Prenatal care  Write down your questions. Take them to your prenatal visits.  Keep all your prenatal visits as told by your health care provider. This is important. Safety  Wear your seat belt at all times when driving.  Make  a list of emergency phone numbers, including numbers for family, friends, the hospital, and police and fire departments. General instructions  Avoid cat litter boxes and soil used by cats. These carry germs that can cause birth defects in the baby. If you have a cat, ask someone to clean the litter box for you.  Do not travel far distances unless it is absolutely necessary and only with the approval of your health care provider.  Do not use hot tubs, steam rooms, or saunas.  Do not drink alcohol.  Do not use any products that contain nicotine or tobacco, such as cigarettes and e-cigarettes. If you need help quitting, ask your health care provider.  Do not use any medicinal herbs or unprescribed drugs. These chemicals affect the formation and growth of the baby.  Do not douche or use tampons or scented sanitary pads.  Do not cross your legs for long periods of time.  To prepare for the arrival of your baby: ? Take prenatal classes to understand, practice, and ask questions about labor and delivery. ? Make a trial run to the hospital. ? Visit the hospital and tour the maternity area. ? Arrange for maternity or paternity leave through employers. ? Arrange for family and friends to take care of pets while you are in the hospital. ? Purchase a rear-facing car seat and make sure you know how to install it in your car. ? Pack your hospital bag. ? Prepare the baby's nursery. Make sure to remove all pillows and stuffed animals from the baby's crib to prevent suffocation.  Visit your dentist if you have not gone during your pregnancy. Use a soft toothbrush to brush your teeth and be gentle when you floss. Contact a health care provider if:  You are unsure if you are in labor or if your water has broken.  You become dizzy.  You have mild pelvic cramps, pelvic pressure, or nagging pain in your abdominal area.  You have lower back pain.  You have persistent nausea, vomiting, or diarrhea.   You have an unusual or bad smelling vaginal discharge.  You have pain when you urinate. Get help right away if:  Your water breaks before 37 weeks.  You have regular contractions less than 5 minutes apart before 37 weeks.  You have a fever.  You are leaking fluid from your vagina.  You have spotting or bleeding from your vagina.  You have severe abdominal pain or cramping.  You have rapid weight loss or weight gain.  You have   shortness of breath with chest pain.  You notice sudden or extreme swelling of your face, hands, ankles, feet, or legs.  Your baby makes fewer than 10 movements in 2 hours.  You have severe headaches that do not go away when you take medicine.  You have vision changes. Summary  The third trimester is from week 28 through week 40, months 7 through 9. The third trimester is a time when the unborn baby (fetus) is growing rapidly.  During the third trimester, your discomfort may increase as you and your baby continue to gain weight. You may have abdominal, leg, and back pain, sleeping problems, and an increased need to urinate.  During the third trimester your breasts will keep growing and they will continue to become tender. A yellow fluid (colostrum) may leak from your breasts. This is the first milk you are producing for your baby.  False labor is a condition in which you feel small, irregular tightenings of the muscles in the womb (contractions) that eventually go away. These are called Braxton Hicks contractions. Contractions may last for hours, days, or even weeks before true labor sets in.  Signs of labor can include: abdominal cramps; regular contractions that start at 10 minutes apart and become stronger and more frequent with time; watery or bloody mucus discharge that comes from the vagina; increased pelvic pressure and dull back pain; and leaking of amniotic fluid. This information is not intended to replace advice given to you by your health  care provider. Make sure you discuss any questions you have with your health care provider. Document Released: 01/28/2001 Document Revised: 05/27/2018 Document Reviewed: 03/11/2016 Elsevier Patient Education  2020 [employer]  

## 2019-01-27 NOTE — Progress Notes (Signed)
   PRENATAL VISIT NOTE  Subjective:  Brandi Norris is a 23 y.o. V7C5885 at [redacted]w[redacted]d being seen today for ongoing prenatal care.  She is currently monitored for the following issues for this low-risk pregnancy and has Morbid obesity ([place]); Alpha thalassemia trait; Supervision of other normal pregnancy, antepartum; and History of gestational hypertension on their problem list.  Patient reports yeast infection- white discharge and swellin gof the vulva.  Contractions: Not present. Vag. Bleeding: None.  Movement: Present. Denies leaking of fluid.   The following portions of the patient's history were reviewed and updated as appropriate: allergies, current medications, past family history, past medical history, past social history, past surgical history and problem list.   Objective:   Vitals:   01/27/19 1043  BP: 119/77  Pulse: 90  Weight: 254 lb (115.2 kg)    Fetal Status: Fetal Heart Rate (bpm): 165   Movement: Present     General:  Alert, oriented and cooperative. Patient is in no acute distress.  Skin: Skin is warm and dry. No rash noted.   Cardiovascular: Normal heart rate noted  Respiratory: Normal respiratory effort, no problems with respiration noted  Abdomen: Soft, gravid, appropriate for gestational age.  Pain/Pressure: Absent     Pelvic: Cervical exam deferred        Extremities: Normal range of motion.  Edema: None  Mental Status: Normal mood and affect. Normal behavior. Normal judgment and thought content.   Assessment and Plan:  Pregnancy: O2D7412 at [redacted]w[redacted]d 1. Supervision of other normal pregnancy, antepartum Has f/u US scheduled for mild pyelectasis.   2. Morbid obesity ([place]) 01/27/2019  Est. FW:    2421  gm      5 lb 5 oz     88  %  3. History of gestational hypertension BP WNL  4. Alpha thalassemia trait  Preterm labor symptoms and general obstetric precautions including but not limited to vaginal bleeding, contractions, leaking of fluid and fetal movement were  reviewed in detail with the patient. Please refer to After Visit Summary for other counseling recommendations.   Return in about 2 weeks (around 02/10/2019) for in person.  Future Appointments  Date Time Provider [place]  01/27/2019 11:15 AM Lavonia Drafts, MD [facility] None  02/07/2019 11:00 AM Truett Mainland, DO [facility] None  02/25/2019  8:15 AM [facility] [place] 4 [facility]-US    Lavonia Drafts, MD

## 2019-02-07 ENCOUNTER — Ambulatory Visit (INDEPENDENT_AMBULATORY_CARE_PROVIDER_SITE_OTHER): Payer: Medicaid Other | Admitting: Family Medicine

## 2019-02-07 ENCOUNTER — Encounter: Payer: Self-pay | Admitting: Family Medicine

## 2019-02-07 ENCOUNTER — Other Ambulatory Visit: Payer: Self-pay

## 2019-02-07 ENCOUNTER — Other Ambulatory Visit (HOSPITAL_COMMUNITY)
Admission: RE | Admit: 2019-02-07 | Discharge: 2019-02-07 | Disposition: A | Discharge status: Home / Self Care | Payer: Medicaid Other | Source: Ambulatory Visit | Attending: Family Medicine | Admitting: Family Medicine

## 2019-02-07 VITALS — BP 106/75 | HR 99 | Wt 251.0 lb

## 2019-02-07 DIAGNOSIS — D563 Thalassemia minor: Secondary | ICD-10-CM

## 2019-02-07 DIAGNOSIS — O35EXX0 Maternal care for other (suspected) fetal abnormality and damage, fetal genitourinary anomalies, not applicable or unspecified: Secondary | ICD-10-CM | POA: Insufficient documentation

## 2019-02-07 DIAGNOSIS — R8271 Bacteriuria: Secondary | ICD-10-CM | POA: Insufficient documentation

## 2019-02-07 DIAGNOSIS — Z8759 Personal history of other complications of pregnancy, childbirth and the puerperium: Secondary | ICD-10-CM

## 2019-02-07 DIAGNOSIS — N898 Other specified noninflammatory disorders of vagina: Secondary | ICD-10-CM | POA: Diagnosis not present

## 2019-02-07 DIAGNOSIS — O358XX0 Maternal care for other (suspected) fetal abnormality and damage, not applicable or unspecified: Secondary | ICD-10-CM | POA: Insufficient documentation

## 2019-02-07 DIAGNOSIS — Z3A34 34 weeks gestation of pregnancy: Secondary | ICD-10-CM

## 2019-02-07 DIAGNOSIS — Z3480 Encounter for supervision of other normal pregnancy, unspecified trimester: Secondary | ICD-10-CM

## 2019-02-07 NOTE — Progress Notes (Signed)
   PRENATAL VISIT NOTE  Subjective:  Brandi Norris is a 23 y.o. [id number] at [redacted]w[redacted]d being seen today for ongoing prenatal care.  She is currently monitored for the following issues for this high-risk pregnancy and has Morbid obesity ([place]); Alpha thalassemia trait; Supervision of other normal pregnancy, antepartum; and History of gestational hypertension on their problem list.  Patient reports pelvic pain, vaginal irritation.  Contractions: Irritability. Vag. Bleeding: None.  Movement: Present. Denies leaking of fluid.   The following portions of the patient's history were reviewed and updated as appropriate: allergies, current medications, past family history, past medical history, past social history, past surgical history and problem list.   Objective:   Vitals:   02/07/19 1057  BP: 106/75  Pulse: 99  Weight: 251 lb (113.9 kg)    Fetal Status: Fetal Heart Rate (bpm): 153   Movement: Present     General:  Alert, oriented and cooperative. Patient is in no acute distress.  Skin: Skin is warm and dry. No rash noted.   Cardiovascular: Normal heart rate noted  Respiratory: Normal respiratory effort, no problems with respiration noted  Abdomen: Soft, gravid, appropriate for gestational age.  Pain/Pressure: Absent     Pelvic: Cervical exam deferred        Extremities: Normal range of motion.  Edema: None  Mental Status: Normal mood and affect. Normal behavior. Normal judgment and thought content.   Assessment and Plan:  Pregnancy: G4P1021 at [redacted]w[redacted]d 1. Vaginal itching - Cervicovaginal ancillary only( [place])  2. Supervision of other normal pregnancy, antepartum FHT and FH normal. Exercises given for pelvic pain. Baby has   3. Morbid obesity ([place]) Cont serial growth [place]  4. History of gestational hypertension Continue ASA 81mg . BP controlled  5. Alpha thalassemia trait  6. GBS bacteriuria Intrapartum ppx  7. Pyelectasis of fetus on prenatal ultrasound Will likely need [place]  after delivery     Preterm labor symptoms and general obstetric precautions including but not limited to vaginal bleeding, contractions, leaking of fluid and fetal movement were reviewed in detail with the patient. Please refer to After Visit Summary for other counseling recommendations.   Return in about 2 weeks (around 02/21/2019) for OB f/u, In Office.  Future Appointments  Date Time Provider [place]  02/25/2019  8:15 AM [facility] [place] 4 [facility]  02/25/2019  8:20 AM [facility] NURSE [place] [facility]  02/25/2019 10:45 AM Truett Mainland, DO [facility] None    Truett Mainland, DO

## 2019-02-08 LAB — CERVICOVAGINAL ANCILLARY ONLY
Bacterial Vaginitis (gardnerella): NEGATIVE
Candida Glabrata: NEGATIVE
Candida Vaginitis: POSITIVE — AB
Comment: NEGATIVE
Comment: NEGATIVE
Comment: NEGATIVE

## 2019-02-09 ENCOUNTER — Telehealth: Payer: Self-pay

## 2019-02-09 DIAGNOSIS — B373 Candidiasis of vulva and vagina: Secondary | ICD-10-CM

## 2019-02-09 DIAGNOSIS — B3731 Acute candidiasis of vulva and vagina: Secondary | ICD-10-CM

## 2019-02-09 MED ORDER — TERCONAZOLE 0.4 % VA CREA: TOPICAL_CREAM | VAGINAL | 0 refills | Status: DC

## 2019-02-09 NOTE — Telephone Encounter (Signed)
Called pt regarding results. Left message for pt to call the office back. Terconazole was sent to the pharmacy.  Zerrick Hanssen l Hannia Matchett, CMA

## 2019-02-09 NOTE — Telephone Encounter (Signed)
Patient returned call to the office and made aware of yeast in fection. Patient also made aware that terconazole called in for her to her pharmacy. Kathrene Alu RN

## 2019-02-11 ENCOUNTER — Other Ambulatory Visit: Payer: Self-pay | Admitting: Family Medicine

## 2019-02-11 MED ORDER — FLUCONAZOLE 150 MG PO TABS: 150.0000 mg | ORAL_TABLET | Freq: Once | ORAL | 0 refills | Status: AC

## 2019-02-18 NOTE — L&D Delivery Note (Addendum)
OB/GYN Faculty Practice Delivery Note  Brandi Norris is a 24 y.o. P7D5789 s/p SVD at [redacted]w[redacted]d. She was admitted for IOL for gHTN.   ROM: 5h 62m with clear fluid GBS Status: positive, s/p PCN Maximum Maternal Temperature: 100.76F  Labor Progress: . Patient presented to L&D for IOL for gHTN. Labor course course uncomplicated. She then progressed to complete at 1847.   Delivery Date/Time: 03/07/19 @1909  Delivery: Called to room and patient was complete and pushing. Head was OA and delivered with ease. No nuchal cord present. Shoulder and body delivered in usual fashion. Infant with spontaneous cry, placed on mother's abdomen, dried and stimulated. Cord clamped x 2 after 1-minute delay, and cut by FOB. Cord blood drawn. Placenta delivered spontaneously with gentle cord traction. Fundus firm with massage and Pitocin. Labia, perineum, vagina, and cervix inspected with only 2 small superficial 1st degree lacerations of the perineum. Baby Weight: pending  Placenta: Sent to L&D Complications: None Lacerations: 2 small 1st degree lacerations over the perineum-hemostatic EBL: 150cc Analgesia: Epidural  Infant: APGAR (1 MIN): 9   APGAR (5 MINS): 9  , DO, PGY-1 OBGYN Faculty Teaching Service  03/07/2019, 7:43 PM   Midwife attestation: I was gloved and present for delivery in its entirety and I agree with the above resident's note.  03/09/2019, CNM 8:03 PM

## 2019-02-24 ENCOUNTER — Other Ambulatory Visit (HOSPITAL_COMMUNITY)
Admission: RE | Admit: 2019-02-24 | Discharge: 2019-02-24 | Disposition: A | Discharge status: Home / Self Care | Payer: Medicaid Other | Source: Ambulatory Visit | Attending: Family Medicine | Admitting: Family Medicine

## 2019-02-24 ENCOUNTER — Ambulatory Visit (INDEPENDENT_AMBULATORY_CARE_PROVIDER_SITE_OTHER): Payer: Medicaid Other | Admitting: Family Medicine

## 2019-02-24 VITALS — BP 119/82 | HR 93 | Wt 255.0 lb

## 2019-02-24 DIAGNOSIS — Z8759 Personal history of other complications of pregnancy, childbirth and the puerperium: Secondary | ICD-10-CM

## 2019-02-24 DIAGNOSIS — R8271 Bacteriuria: Secondary | ICD-10-CM

## 2019-02-24 DIAGNOSIS — Z3480 Encounter for supervision of other normal pregnancy, unspecified trimester: Secondary | ICD-10-CM | POA: Diagnosis present

## 2019-02-24 DIAGNOSIS — F419 Anxiety disorder, unspecified: Secondary | ICD-10-CM

## 2019-02-24 DIAGNOSIS — O3663X0 Maternal care for excessive fetal growth, third trimester, not applicable or unspecified: Secondary | ICD-10-CM

## 2019-02-24 DIAGNOSIS — Z3A36 36 weeks gestation of pregnancy: Secondary | ICD-10-CM

## 2019-02-24 DIAGNOSIS — O35EXX0 Maternal care for other (suspected) fetal abnormality and damage, fetal genitourinary anomalies, not applicable or unspecified: Secondary | ICD-10-CM

## 2019-02-24 DIAGNOSIS — O358XX0 Maternal care for other (suspected) fetal abnormality and damage, not applicable or unspecified: Secondary | ICD-10-CM

## 2019-02-24 DIAGNOSIS — D563 Thalassemia minor: Secondary | ICD-10-CM

## 2019-02-24 NOTE — Progress Notes (Signed)
   PRENATAL VISIT NOTE  Subjective:  Brandi Norris is a 24 y.o. H2D9242 at [redacted]w[redacted]d being seen today for ongoing prenatal care.  She is currently monitored for the following issues for this high-risk pregnancy and has Morbid obesity ([facility]); Alpha thalassemia trait; Supervision of other normal pregnancy, antepartum; History of gestational hypertension; GBS bacteriuria; and Pyelectasis of fetus on prenatal ultrasound on their problem list.  Patient reports no complaints.  Contractions: Irritability.  .  Movement: Present. Denies leaking of fluid.   The following portions of the patient's history were reviewed and updated as appropriate: allergies, current medications, past family history, past medical history, past social history, past surgical history and problem list.   Objective:   Vitals:   02/24/19 1559  BP: 119/82  Pulse: 93  Weight: 255 lb (115.7 kg)    Fetal Status: Fetal Heart Rate (bpm): 150   Movement: Present     General:  Alert, oriented and cooperative. Patient is in no acute distress.  Skin: Skin is warm and dry. No rash noted.   Cardiovascular: Normal heart rate noted  Respiratory: Normal respiratory effort, no problems with respiration noted  Abdomen: Soft, gravid, appropriate for gestational age.  Pain/Pressure: Present     Pelvic: Cervical exam deferred        Extremities: Normal range of motion.  Edema: Trace  Mental Status: Normal mood and affect. Normal behavior. Normal judgment and thought content.   Assessment and Plan:  Pregnancy: G4P1021 at [redacted]w[redacted]d 1. Supervision of other normal pregnancy, antepartum FHT and FH nomral - GC/Chlamydia probe amp ([place])not at [facility]  2. Anxiety - Ambulatory referral to Integrated Behavioral [facility]  3. Morbid obesity (HCC)  4. Pyelectasis of fetus on prenatal ultrasound stable  5. History of gestational hypertension Not on ASA despite being told multiple times BP normal  6. GBS bacteriuria Intrapartum ppx  7.  Alpha thalassemia trait  8. Fetal Macrosomia Induce 39 weeks  Preterm labor symptoms and general obstetric precautions including but not limited to vaginal bleeding, contractions, leaking of fluid and fetal movement were reviewed in detail with the patient. Please refer to After Visit Summary for other counseling recommendations.   No follow-ups on file.  No future appointments.  Levie Heritage, DO

## 2019-02-25 ENCOUNTER — Encounter (HOSPITAL_COMMUNITY): Payer: Self-pay

## 2019-02-25 ENCOUNTER — Ambulatory Visit (HOSPITAL_COMMUNITY): Payer: Medicaid Other

## 2019-02-25 ENCOUNTER — Encounter: Payer: Medicaid Other | Admitting: Family Medicine

## 2019-02-28 LAB — GC/CHLAMYDIA PROBE AMP (~~LOC~~) NOT AT ARMC
Chlamydia: NEGATIVE
Comment: NEGATIVE
Comment: NORMAL
Neisseria Gonorrhea: NEGATIVE

## 2019-03-01 NOTE — BH Specialist Note (Signed)
Pt requests to reschedule to 9:15am 03/15/19  [facility] via Telemedicine Video Visit  03/01/2019 Joelys Staubs [phone]   Rae Lips

## 2019-03-02 ENCOUNTER — Ambulatory Visit: Payer: Medicaid Other | Admitting: Clinical

## 2019-03-02 ENCOUNTER — Other Ambulatory Visit: Payer: Self-pay

## 2019-03-04 ENCOUNTER — Encounter (HOSPITAL_COMMUNITY): Payer: Self-pay

## 2019-03-04 ENCOUNTER — Ambulatory Visit (HOSPITAL_COMMUNITY)
Admission: RE | Admit: 2019-03-04 | Discharge: 2019-03-04 | Disposition: A | Discharge status: Home / Self Care | Payer: Medicaid Other | Source: Ambulatory Visit | Attending: Obstetrics & Gynecology | Admitting: Obstetrics & Gynecology

## 2019-03-04 ENCOUNTER — Ambulatory Visit (HOSPITAL_COMMUNITY): Payer: Medicaid Other | Admitting: *Deleted

## 2019-03-04 ENCOUNTER — Ambulatory Visit (INDEPENDENT_AMBULATORY_CARE_PROVIDER_SITE_OTHER): Payer: Medicaid Other | Admitting: Obstetrics & Gynecology

## 2019-03-04 ENCOUNTER — Encounter: Payer: Self-pay | Admitting: Obstetrics & Gynecology

## 2019-03-04 ENCOUNTER — Other Ambulatory Visit: Payer: Self-pay

## 2019-03-04 VITALS — BP 111/74 | HR 106 | Wt 260.0 lb

## 2019-03-04 DIAGNOSIS — Z362 Encounter for other antenatal screening follow-up: Secondary | ICD-10-CM

## 2019-03-04 DIAGNOSIS — Z8759 Personal history of other complications of pregnancy, childbirth and the puerperium: Secondary | ICD-10-CM

## 2019-03-04 DIAGNOSIS — O99213 Obesity complicating pregnancy, third trimester: Secondary | ICD-10-CM

## 2019-03-04 DIAGNOSIS — Z3480 Encounter for supervision of other normal pregnancy, unspecified trimester: Secondary | ICD-10-CM | POA: Diagnosis not present

## 2019-03-04 DIAGNOSIS — O09293 Supervision of pregnancy with other poor reproductive or obstetric history, third trimester: Secondary | ICD-10-CM | POA: Diagnosis not present

## 2019-03-04 DIAGNOSIS — O358XX0 Maternal care for other (suspected) fetal abnormality and damage, not applicable or unspecified: Secondary | ICD-10-CM

## 2019-03-04 DIAGNOSIS — Z3A37 37 weeks gestation of pregnancy: Secondary | ICD-10-CM

## 2019-03-04 DIAGNOSIS — R8271 Bacteriuria: Secondary | ICD-10-CM

## 2019-03-04 DIAGNOSIS — O35EXX0 Maternal care for other (suspected) fetal abnormality and damage, fetal genitourinary anomalies, not applicable or unspecified: Secondary | ICD-10-CM

## 2019-03-04 DIAGNOSIS — D563 Thalassemia minor: Secondary | ICD-10-CM

## 2019-03-04 NOTE — Progress Notes (Signed)
   PRENATAL VISIT NOTE  Subjective:  Brandi Norris is a 24 y.o. V6H6073 at [redacted]w[redacted]d being seen today for ongoing prenatal care.  She is currently monitored for the following issues for this high-risk pregnancy and has Morbid obesity (H[facility]); Alpha thalassemia trait; Supervision of other normal pregnancy, antepartum; History of gestational hypertension; GBS bacteriuria; and Pyelectasis of fetus on prenatal ultrasound on their problem list.  Patient reports occasional contractions.  Contractions: Irregular. Vag. Bleeding: None.  Movement: Present. Denies leaking of fluid.   The following portions of the patient's history were reviewed and updated as appropriate: allergies, current medications, past family history, past medical history, past social history, past surgical history and problem list.   Objective:   Vitals:   03/04/19 0910  BP: 111/74  Pulse: (!) 106  Weight: 260 lb (117.9 kg)    Fetal Status: Fetal Heart Rate (bpm): 151   Movement: Present     General:  Alert, oriented and cooperative. Patient is in no acute distress.  Skin: Skin is warm and dry. No rash noted.   Cardiovascular: Normal heart rate noted  Respiratory: Normal respiratory effort, no problems with respiration noted  Abdomen: Soft, gravid, appropriate for gestational age.  Pain/Pressure: Present     Pelvic: Cervical exam performed        Extremities: Normal range of motion.  Edema: None  Mental Status: Normal mood and affect. Normal behavior. Normal judgment and thought content.   Assessment and Plan:  Pregnancy: G4P1021 at [redacted]w[redacted]d 1. Supervision of other normal pregnancy, antepartum Good FM  FH not done  2. Morbid obesity (HCC) Pt missed last [place] due to childcare 01/27/2019  Est. FW:    2421  gm      5 lb 5 oz     88  % Need to assess fetal growth.   I discussed with pt IOL vs expectant management. She is comfortable with f/u in 1 week and IOL after 40 weeks   3. History of gestational hypertension BP all  WNL  4. GBS bacteriuria Need atbx in labor   5. Alpha thalassemia trait  6. Pyelectasis of fetus on prenatal ultrasound  Term labor symptoms and general obstetric precautions including but not limited to vaginal bleeding, contractions, leaking of fluid and fetal movement were reviewed in detail with the patient. Please refer to After Visit Summary for other counseling recommendations.   Return in about 1 week (around 03/11/2019) for in person.  Future Appointments  Date Time Provider Department Center  03/15/2019  9:15 AM [facility]    Willodean Rosenthal, MD

## 2019-03-05 ENCOUNTER — Inpatient Hospital Stay (EMERGENCY_DEPARTMENT_HOSPITAL)
Admission: AD | Admit: 2019-03-05 | Discharge: 2019-03-06 | Disposition: A | Discharge status: Home / Self Care | Payer: Medicaid Other | Source: Ambulatory Visit | Attending: Obstetrics and Gynecology | Admitting: Obstetrics and Gynecology

## 2019-03-05 ENCOUNTER — Other Ambulatory Visit: Payer: Self-pay

## 2019-03-05 DIAGNOSIS — Z8759 Personal history of other complications of pregnancy, childbirth and the puerperium: Secondary | ICD-10-CM

## 2019-03-05 DIAGNOSIS — Z8679 Personal history of other diseases of the circulatory system: Secondary | ICD-10-CM | POA: Insufficient documentation

## 2019-03-05 DIAGNOSIS — Z3A38 38 weeks gestation of pregnancy: Secondary | ICD-10-CM | POA: Insufficient documentation

## 2019-03-05 DIAGNOSIS — Z823 Family history of stroke: Secondary | ICD-10-CM | POA: Insufficient documentation

## 2019-03-05 DIAGNOSIS — O368190 Decreased fetal movements, unspecified trimester, not applicable or unspecified: Secondary | ICD-10-CM

## 2019-03-05 DIAGNOSIS — O368130 Decreased fetal movements, third trimester, not applicable or unspecified: Secondary | ICD-10-CM | POA: Insufficient documentation

## 2019-03-05 NOTE — MAU Note (Signed)
Patient reports to MAU c/o ctx and decreased fetal movement. Patient reports last movement felt this morning when she woke up. Patient reports bloody show and denies LOF.

## 2019-03-05 NOTE — MAU Provider Note (Addendum)
Chief Complaint:  Contractions and Decreased Fetal Movement   First Provider Initiated Contact with Patient 03/05/19 2257      HPI: Brandi Norris is a 24 y.o. [id number] at 46w0dby LMP, early ultrasound who presents to maternity admissions reporting decreased fetal movement. Reports that she "eats ice all day and that baby is normally very active." She last felt movement earlier this morning. Reports some clear/brown discharge around 1800 tonight. Reports irregular ctx since 1800; every 20-25 minutes which she describes as BMontine Circle Some pain in her lower back. Reports tolerating PO well today. Last ate just prior to presenting to MAU. Reports baby normally moves all day and reports no movement since this morning. Pregnancy has been followed for left UTD otherwise no complications. Hx of gHTN. Patient reports feeling one kick when monitors applied in MAU.  She denies LOF, vaginal bleeding, vaginal itching/burning, urinary symptoms, h/a, dizziness, n/v, or fever/chills.    Past Medical History: Past Medical History:  Diagnosis Date  . Miscarriage   . Pregnancy induced hypertension     Past obstetric history: OB History  Gravida Para Term Preterm AB Living  '4 1 1   2 1  '$ SAB TAB Ectopic Multiple Live Births  2     0 1    # Outcome Date GA Lbr Len/2nd Weight Sex Delivery Anes PTL Lv  4 Current           3 Term 07/01/17 [id number]:30 / 00:40 3270 g M Vag-Spont EPI  LIV  2 SAB           1 SAB             Past Surgical History: Past Surgical History:  Procedure Laterality Date  . DILATION AND CURETTAGE OF UTERUS    . WISDOM TOOTH EXTRACTION      Family History: Family History  Problem Relation Age of Onset  . Stroke Mother   . Fibromyalgia Mother   . Neuropathy Mother   . Bursitis Mother   . Osteoarthritis Mother   . Aneurysm Father   . Brain cancer Maternal Aunt   . Throat cancer Maternal Uncle   . Breast cancer Paternal Aunt   . Diabetes Maternal Grandmother   .  Hypertension Maternal Grandmother   . Heart disease Paternal Grandmother     Social History: Social History   Tobacco Use  . Smoking status: Never Smoker  . Smokeless tobacco: Never Used  Substance Use Topics  . Alcohol use: No  . Drug use: No    Allergies: No Known Allergies  Meds:  Medications Prior to Admission  Medication Sig Dispense Refill Last Dose  . Acetaminophen (TYLENOL PO) Take by mouth.     . AMBULATORY NON FORMULARY MEDICATION 1 Device by Other route once a week. Blood pressure cuff for monitored regularly at home  ICD 10: O09.90 1 kit 0   . Doxylamine-Pyridoxine (DICLEGIS) 10-10 MG TBEC Take 2 tablets by mouth at bedtime. If symptoms persist, add one tablet in the morning and one in the afternoon (Patient not taking: Reported on 12/21/2018) 100 tablet 5   . hydrOXYzine (ATARAX/VISTARIL) 25 MG tablet Take 1 tablet (25 mg total) by mouth every 6 (six) hours as needed for itching. (Patient not taking: Reported on 02/07/2019) 30 tablet 2   . miconazole (MONISTAT 7) 2 % vaginal cream Place 1 Applicatorful vaginally at bedtime. Apply for seven nights (Patient not taking: Reported on 11/25/2018) 30 g 1   . Prenatal Vit-Fe  Fumarate-FA (PRENATAL VITAMINS PO) Take by mouth.     . Prenatal Vit-Fe Phos-FA-Omega (VITAFOL GUMMIES) 3.33-0.333-34.8 MG CHEW Chew 34.8 mg by mouth 3 (three) times daily. 90 tablet 10   . terconazole (TERAZOL 7) 0.4 % vaginal cream 1 Applicator intravaginally at bedtime x 3 days (Patient not taking: Reported on 03/04/2019) 45 g 0     ROS:  Review of Systems All other systems negative unless noted above in HPI.   I have reviewed patient's Past Medical Hx, Surgical Hx, Family Hx, Social Hx, medications and allergies.   Physical Exam   Patient Vitals for the past 24 hrs:  BP Temp Temp src Pulse Resp SpO2 Height Weight  03/06/19 0155 (!) 146/87 97.9 F (36.6 C) Oral 79 20 100 % -- --  03/05/19 2221 134/86 -- -- 94 20 100 % '5\' 4"'$  (1.626 m) 119.2 kg    Constitutional: Well-developed, well-nourished female in no acute distress.  Cardiovascular: normal rate Respiratory: normal effort GI: Abd soft, non-tender, gravid appropriate for gestational age.  MS: Extremities nontender, no edema, normal ROM Neurologic: Alert and oriented x 4.  GU: Neg CVAT.  Dilation: 1 Effacement (%): 40 Station: Ballotable Exam by:: Denyse Dago, RN  FHT:  Baseline 120, moderate variability, accelerations present, no decelerations Contractions: infrequent vs occasional irritability    Labs: No results found for this or any previous visit (from the past 24 hour(s)). O/Positive/-- (06/26 1012)  Imaging:  [place] MFM OB FOLLOW UP  Result Date: 03/04/2019 ----------------------------------------------------------------------  OBSTETRICS REPORT                       (Signed Final 03/04/2019 04:25 pm) ---------------------------------------------------------------------- Patient Info  ID #:       [id number]                          D.O.B.:  22-Jun-1995 (23 yrs)  Name:       Brandi Norris                  Visit Date: 03/04/2019 03:26 pm ---------------------------------------------------------------------- Performed By  Performed By:     Brandi Norris Tester BS,       Ref. Address:     [place], [address]                                                             [address]  Attending:        Tama High MD        [facility]  Referred By:      [facility] ---------------------------------------------------------------------- Orders   #  Description  Code         Ordered By   1  [place] MFM OB FOLLOW UP                  [id number]     Lavonia Drafts  ----------------------------------------------------------------------   #  Order #                    Accession #                 Episode #    1  [id number]                  [id number]                  [id number]  ---------------------------------------------------------------------- Indications   [redacted] weeks gestation of pregnancy                Z3A.37   Maternal morbid obesity (Pre- pregnancy        O99.210 E66.01   BMI 46)   Poor obstetric history: Previous               O09.299   preeclampsia / eclampsia/gestational HTN   Encounter for other antenatal screening        Z36.2   follow-up   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 Cord Insertion:      Previously Visualized  Amniotic Fluid  AFI FV:      Within normal limits  AFI Sum(cm)     %Tile       Largest Pocket(cm)  16.35           63          6.1  RUQ(cm)       RLQ(cm)       LUQ(cm)        LLQ(cm)  5.04          2.94          6.1            2.27 ---------------------------------------------------------------------- Biometry  BPD:        89  mm     G. Age:  36w 0d         22  %    CI:         75.1   %    70 - 86  FL/HC:      22.1   %    20.9 - 22.7  HC:      325.8  mm     G. Age:  36w 6d         12  %    HC/AC:      0.90        0.92 - 1.05  AC:      363.4  mm     G. Age:  40w 2d         99  %    FL/BPD:     80.9   %    71 - 87  FL:         72  mm     G. Age:  36w 6d         27  %    FL/AC:      19.8   %    20 - 24  Est. FW:    3540  gm    7 lb 13 oz      79  % ---------------------------------------------------------------------- OB History  Gravidity:    4         Term:   1        Prem:   0        SAB:   2  Living:       1  ---------------------------------------------------------------------- Gestational Age  LMP:           37w 6d        Date:  06/12/18                 EDD:   03/19/19  U/S Today:     37w 4d                                        EDD:   03/21/19  Best:          37w 6d     Det. By:  LMP  (06/12/18)          EDD:   03/19/19 ---------------------------------------------------------------------- Anatomy  Cranium:               Previously seen        Aortic Arch:            Previously seen  Cavum:                 Previously seen        Ductal Arch:            Previously seen  Ventricles:            Appears normal         Diaphragm:              Appears normal  Choroid Plexus:        Previously seen        Stomach:                Appears normal, left  sided  Cerebellum:            Previously seen        Abdomen:                Previously seen  Posterior Fossa:       Previously seen        Abdominal Wall:         Previously seen  Nuchal Fold:           Previously seen        Cord Vessels:           Previously seen  Face:                  Orbits and profile     Kidneys:                Left UTD  10 mm                         previously seen  Lips:                  Previously seen        Bladder:                Appears normal  Thoracic:              Previously seen        Spine:                  Previously seen  Heart:                 Previously seen        Upper Extremities:      Previously seen  RVOT:                  Previously seen        Lower Extremities:      Previously seen  LVOT:                  Previously seen  Other:  Nasal bone previously visualized. Heels previously visualized.          Technically difficult due to maternal habitus and fetal position. ---------------------------------------------------------------------- Cervix Uterus Adnexa  Cervix  Not visualized (advanced GA >24wks)  Uterus  No abnormality visualized.  Left Ovary  Within  normal limits. No adnexal mass visualized.  Right Ovary  Within normal limits. No adnexal mass visualized.  Cul De Sac  No free fluid seen.  Adnexa  No adnexal mass visualized. ---------------------------------------------------------------------- Impression  Fetal growth is appropriate for gestational age. Amniotic fluid  is normal and good fetal activity is seen.  Renal pelvis was  difficult to assess, but the left renal pelvis measures 11 mm,  which is essentially unchanged from previous scan.  Both  kidneys look, otherwise, normal with no increased  echogenicities.  BP at our office is 131/85 mmHg. ---------------------------------------------------------------------- Recommendations  Follow-up scans as clinically indicated. ----------------------------------------------------------------------                  Tama High, MD Electronically Signed Final Report   03/04/2019 04:25 pm ----------------------------------------------------------------------   MAU Course/MDM: Orders Placed This Encounter  Procedures  . [place] MFM FETAL BPP WO NON STRESS  . Discharge patient Discharge disposition: 01-Home or Self Care; Discharge patient date: 03/06/2019    No orders of the defined types were placed in this encounter.  NST reviewed: reactive; TOCO with no ctx vs irritability  Patient given cold juice and ate crackers; continued to report no fetal movement 1 hour thereafter. Sent for BPP 8/8. Upon discussion with patient thereafter, she began to feel movements as well and reassured by BPP. She has a clinic appt this week and will likely have IOL scheduled within next week. Patient with hx of gHTN in prior pregnancy. BP's monitored during MAU stay and last BP mildly elevated. Patient desired to discharge and has a BP cuff at home and will watch them; reports elevated BP as she was moving around and is hungry. Given parameters and advised to return to MAU if elevated or if she develops symptoms. Pt discharged  with strict return precautions.  Assessment: 1. Decreased fetal movements in third trimester, single or unspecified fetus   2. Decreased fetal movement   3. Decreased fetal movement affecting management of pregnancy   4. History of gestational hypertension     Plan: Discharge home Labor precautions and fetal kick counts  Allergies as of 03/06/2019   No Known Allergies     Medication List    STOP taking these medications   Doxylamine-Pyridoxine 10-10 MG Tbec Commonly known as: Diclegis   hydrOXYzine 25 MG tablet Commonly known as: ATARAX/VISTARIL   miconazole 2 % vaginal cream Commonly known as: MONISTAT 7   terconazole 0.4 % vaginal cream Commonly known as: TERAZOL 7     TAKE these medications   AMBULATORY NON FORMULARY MEDICATION 1 Device by Other route once a week. Blood pressure cuff for monitored regularly at home  ICD 10: O09.90   PRENATAL VITAMINS PO Take by mouth.   TYLENOL PO Take by mouth.   Vitafol Gummies 3.33-0.333-34.8 MG Chew Chew 34.8 mg by mouth 3 (three) times daily.       Barrington Ellison, MD [facility] Family Medicine Fellow, [facility] for [facility] 03/06/2019 1:57 AM

## 2019-03-06 ENCOUNTER — Inpatient Hospital Stay: Payer: Medicaid Other

## 2019-03-06 ENCOUNTER — Inpatient Hospital Stay (HOSPITAL_COMMUNITY)
Admission: AD | Admit: 2019-03-06 | Discharge: 2019-03-09 | DRG: 807 | Disposition: A | Discharge status: Home / Self Care | Payer: Medicaid Other | Attending: Obstetrics and Gynecology | Admitting: Obstetrics and Gynecology

## 2019-03-06 ENCOUNTER — Other Ambulatory Visit: Payer: Self-pay

## 2019-03-06 ENCOUNTER — Encounter (HOSPITAL_COMMUNITY): Payer: Self-pay | Admitting: Obstetrics & Gynecology

## 2019-03-06 ENCOUNTER — Inpatient Hospital Stay (HOSPITAL_COMMUNITY): Payer: Medicaid Other

## 2019-03-06 DIAGNOSIS — Z3A38 38 weeks gestation of pregnancy: Secondary | ICD-10-CM | POA: Diagnosis not present

## 2019-03-06 DIAGNOSIS — O9902 Anemia complicating childbirth: Secondary | ICD-10-CM | POA: Diagnosis present

## 2019-03-06 DIAGNOSIS — O99213 Obesity complicating pregnancy, third trimester: Secondary | ICD-10-CM

## 2019-03-06 DIAGNOSIS — O368131 Decreased fetal movements, third trimester, fetus 1: Secondary | ICD-10-CM

## 2019-03-06 DIAGNOSIS — Z20822 Contact with and (suspected) exposure to covid-19: Secondary | ICD-10-CM | POA: Diagnosis present

## 2019-03-06 DIAGNOSIS — R030 Elevated blood-pressure reading, without diagnosis of hypertension: Secondary | ICD-10-CM | POA: Diagnosis present

## 2019-03-06 DIAGNOSIS — O368130 Decreased fetal movements, third trimester, not applicable or unspecified: Secondary | ICD-10-CM | POA: Diagnosis present

## 2019-03-06 DIAGNOSIS — Z8759 Personal history of other complications of pregnancy, childbirth and the puerperium: Secondary | ICD-10-CM

## 2019-03-06 DIAGNOSIS — O134 Gestational [pregnancy-induced] hypertension without significant proteinuria, complicating childbirth: Secondary | ICD-10-CM | POA: Diagnosis present

## 2019-03-06 DIAGNOSIS — Z3490 Encounter for supervision of normal pregnancy, unspecified, unspecified trimester: Secondary | ICD-10-CM

## 2019-03-06 DIAGNOSIS — O09293 Supervision of pregnancy with other poor reproductive or obstetric history, third trimester: Secondary | ICD-10-CM

## 2019-03-06 DIAGNOSIS — Z8679 Personal history of other diseases of the circulatory system: Secondary | ICD-10-CM | POA: Diagnosis not present

## 2019-03-06 DIAGNOSIS — O99824 Streptococcus B carrier state complicating childbirth: Secondary | ICD-10-CM | POA: Diagnosis present

## 2019-03-06 DIAGNOSIS — Z3480 Encounter for supervision of other normal pregnancy, unspecified trimester: Secondary | ICD-10-CM

## 2019-03-06 DIAGNOSIS — D649 Anemia, unspecified: Secondary | ICD-10-CM | POA: Diagnosis present

## 2019-03-06 DIAGNOSIS — D563 Thalassemia minor: Secondary | ICD-10-CM | POA: Diagnosis present

## 2019-03-06 DIAGNOSIS — R8271 Bacteriuria: Secondary | ICD-10-CM | POA: Diagnosis present

## 2019-03-06 DIAGNOSIS — O99810 Abnormal glucose complicating pregnancy: Secondary | ICD-10-CM

## 2019-03-06 DIAGNOSIS — Z823 Family history of stroke: Secondary | ICD-10-CM | POA: Diagnosis not present

## 2019-03-06 DIAGNOSIS — O99214 Obesity complicating childbirth: Secondary | ICD-10-CM | POA: Diagnosis present

## 2019-03-06 LAB — PROTEIN / CREATININE RATIO, URINE
Creatinine, Urine: 342.71 mg/dL
Protein Creatinine Ratio: 0.09 mg/mg{Cre} (ref 0.00–0.15)
Total Protein, Urine: 32 mg/dL

## 2019-03-06 LAB — URINALYSIS, ROUTINE W REFLEX MICROSCOPIC
Bilirubin Urine: NEGATIVE
Glucose, UA: NEGATIVE mg/dL
Hgb urine dipstick: NEGATIVE
Ketones, ur: NEGATIVE mg/dL
Nitrite: NEGATIVE
Protein, ur: 100 mg/dL — AB
Specific Gravity, Urine: 1.021 (ref 1.005–1.030)
pH: 6 (ref 5.0–8.0)

## 2019-03-06 LAB — COMPREHENSIVE METABOLIC PANEL
ALT: 10 U/L (ref 0–44)
AST: 17 U/L (ref 15–41)
Albumin: 2.6 g/dL — ABNORMAL LOW (ref 3.5–5.0)
Alkaline Phosphatase: 111 U/L (ref 38–126)
Anion gap: 8 (ref 5–15)
BUN: 5 mg/dL — ABNORMAL LOW (ref 6–20)
CO2: 21 mmol/L — ABNORMAL LOW (ref 22–32)
Calcium: 8.9 mg/dL (ref 8.9–10.3)
Chloride: 108 mmol/L (ref 98–111)
Creatinine, Ser: 0.67 mg/dL (ref 0.44–1.00)
GFR calc Af Amer: >60 mL/min (ref >60)
GFR calc non Af Amer: >60 mL/min (ref >60)
Glucose, Bld: 82 mg/dL (ref 70–99)
Potassium: 3.6 mmol/L (ref 3.5–5.1)
Sodium: 137 mmol/L (ref 135–145)
Total Bilirubin: 0.6 mg/dL (ref 0.3–1.2)
Total Protein: 6.7 g/dL (ref 6.5–8.1)

## 2019-03-06 LAB — CBC
HCT: 29.1 % — ABNORMAL LOW (ref 36.0–46.0)
Hemoglobin: 9.1 g/dL — ABNORMAL LOW (ref 12.0–15.0)
MCH: 22.3 pg — ABNORMAL LOW (ref 26.0–34.0)
MCHC: 31.3 g/dL (ref 30.0–36.0)
MCV: 71.3 fL — ABNORMAL LOW (ref 80.0–100.0)
Platelets: 277 10*3/uL (ref 150–400)
RBC: 4.08 MIL/uL (ref 3.87–5.11)
RDW: 14.4 % (ref 11.5–15.5)
WBC: 10.2 10*3/uL (ref 4.0–10.5)
nRBC: 0 % (ref 0.0–0.2)

## 2019-03-06 LAB — ABO/RH: ABO/RH(D): O POS

## 2019-03-06 LAB — RESPIRATORY PANEL BY RT PCR (FLU A&B, COVID)
Influenza A by PCR: NEGATIVE
Influenza B by PCR: NEGATIVE
SARS Coronavirus 2 by RT PCR: NEGATIVE

## 2019-03-06 LAB — TYPE AND SCREEN
ABO/RH(D): O POS
Antibody Screen: NEGATIVE

## 2019-03-06 MED ORDER — OXYTOCIN 40 UNITS IN NORMAL SALINE INFUSION - SIMPLE MED
2.5000 [IU]/h | INTRAVENOUS | Status: DC
  Filled 2019-03-06: qty 1000

## 2019-03-06 MED ORDER — SODIUM CHLORIDE 0.9 % IV SOLN
5.0000 10*6.[IU] | Freq: Once | INTRAVENOUS | Status: AC
  Administered 2019-03-06: 5 10*6.[IU] via INTRAVENOUS
  Filled 2019-03-06: qty 5

## 2019-03-06 MED ORDER — SODIUM CHLORIDE 0.9 % IV SOLN
510.0000 mg | Freq: Once | INTRAVENOUS | Status: AC
  Administered 2019-03-06: 510 mg via INTRAVENOUS
  Filled 2019-03-06: qty 17

## 2019-03-06 MED ORDER — SODIUM CHLORIDE 0.9 % IV SOLN
INTRAVENOUS | Status: DC | PRN
  Administered 2019-03-06: 1000 mL via INTRAVENOUS

## 2019-03-06 MED ORDER — FENTANYL CITRATE (PF) 100 MCG/2ML IJ SOLN
100.0000 ug | INTRAMUSCULAR | Status: DC | PRN
  Administered 2019-03-07 (×5): 100 ug via INTRAVENOUS
  Filled 2019-03-06 (×5): qty 2

## 2019-03-06 MED ORDER — OXYCODONE-ACETAMINOPHEN 5-325 MG PO TABS: 2.0000 | ORAL_TABLET | ORAL | Status: DC | PRN

## 2019-03-06 MED ORDER — OXYTOCIN BOLUS FROM INFUSION
500.0000 mL | Freq: Once | INTRAVENOUS | Status: AC
  Administered 2019-03-07: 500 mL via INTRAVENOUS

## 2019-03-06 MED ORDER — LIDOCAINE HCL (PF) 1 % IJ SOLN: 30.0000 mL | INTRAMUSCULAR | Status: DC | PRN

## 2019-03-06 MED ORDER — ACETAMINOPHEN 325 MG PO TABS: 650.0000 mg | ORAL_TABLET | ORAL | Status: DC | PRN

## 2019-03-06 MED ORDER — LACTATED RINGERS IV SOLN: INTRAVENOUS | Status: DC

## 2019-03-06 MED ORDER — MISOPROSTOL 50MCG HALF TABLET
ORAL_TABLET | ORAL | Status: AC
  Filled 2019-03-06: qty 1

## 2019-03-06 MED ORDER — PENICILLIN G POT IN DEXTROSE 60000 UNIT/ML IV SOLN
3.0000 10*6.[IU] | INTRAVENOUS | Status: DC
  Administered 2019-03-07 (×4): 3 10*6.[IU] via INTRAVENOUS
  Filled 2019-03-06 (×4): qty 50

## 2019-03-06 MED ORDER — LACTATED RINGERS IV SOLN: 500.0000 mL | INTRAVENOUS | Status: DC | PRN

## 2019-03-06 MED ORDER — SOD CITRATE-CITRIC ACID 500-334 MG/5ML PO SOLN
30.0000 mL | ORAL | Status: DC | PRN
  Administered 2019-03-07: 30 mL via ORAL
  Filled 2019-03-06: qty 30

## 2019-03-06 MED ORDER — ONDANSETRON HCL 4 MG/2ML IJ SOLN: 4.0000 mg | Freq: Four times a day (QID) | INTRAMUSCULAR | Status: DC | PRN

## 2019-03-06 MED ORDER — OXYCODONE-ACETAMINOPHEN 5-325 MG PO TABS: 1.0000 | ORAL_TABLET | ORAL | Status: DC | PRN

## 2019-03-06 MED ORDER — MISOPROSTOL 50MCG HALF TABLET
50.0000 ug | ORAL_TABLET | ORAL | Status: DC
  Administered 2019-03-06 – 2019-03-07 (×3): 50 ug via ORAL
  Filled 2019-03-06 (×2): qty 1

## 2019-03-06 NOTE — MAU Note (Signed)
covid swab collected

## 2019-03-06 NOTE — MAU Note (Signed)
Presents for BP evaluation.  States instructed to be evaluated by [employer] nurse secondary elevated BP with @ home monitor.  Denies H/A, visual disturbances, or epigastric pain.  Reports +FM.  Denies VB or LOF.

## 2019-03-06 NOTE — H&P (Signed)
Brandi Norris is a 24 y.o. female who presented to MAU with chief complaints of new onset elevated pressures on her home cuff as well as facial swelling and lightheadedness. She endorses pressures of 153/99, 150/88 and 151/102 on her [employer] app. Her pressures triggered a nurse call and she was encouraged to present to MAU for evaluation.  Patient states she has been normotensive this pregnancy except for her blood pressure in MAU early this morning. She was advised to take ASA 33m daily as well as PO Fe but has difficulty with consistent adherence and has not taken them recently.   Patient endorses facial swelling earlier today which resolved without intervention. She denies headache, visual disturbances, and RUQ/epigastric pain.  She also denies vaginal bleeding, leaking of fluid, decreased fetal movement, fever, falls, or recent illness.  Her OB history includes Gestational Hypertension in a previous pregnancy.  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 Miscarriage   . Pregnancy induced hypertension     Patient Active Problem List   Diagnosis Date Noted  . GBS bacteriuria 02/07/2019  . Pyelectasis of fetus on prenatal ultrasound 02/07/2019  . Supervision of other normal pregnancy, antepartum 08/13/2018  . History of gestational hypertension 08/13/2018  . Alpha thalassemia trait 03/02/2017  . Morbid [place] 12/05/2016    Past Surgical History:  Procedure Laterality Date  . DILATION AND CURETTAGE OF UTERUS    . WISDOM TOOTH EXTRACTION     Family History: family history includes  Aneurysm in her father; Brain cancer in her maternal aunt; Breast cancer in her paternal aunt; Bursitis in her mother; Diabetes in her maternal grandmother; Fibromyalgia in her mother; Heart disease in her paternal grandmother; Hypertension in her maternal grandmother; Neuropathy in her mother; Osteoarthritis in her mother; Stroke in her mother; Throat cancer in her maternal uncle. Social History:  reports that she has never smoked. She has never used smokeless tobacco. She reports that she does not drink alcohol or use drugs.    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  Eyes: Negative for visual disturbance.  Gastrointestinal: Negative for abdominal pain.  Genitourinary: Negative for vaginal bleeding.  Musculoskeletal: Negative for back pain.  Neurological: Negative for headaches.  All other systems reviewed and are negative.    Blood pressure 111/76, pulse 93, temperature 98.6 F (37 C), temperature source Oral, resp. rate 18, height 5' 4" (1.626 m), weight 118 kg, last menstrual period 06/12/2018, SpO2 100 %, unknown if currently breastfeeding.   Physical Exam  Nursing note and vitals reviewed. Constitutional: She is oriented to person, place, and time. She  appears well-developed and well-nourished.  Cardiovascular: Normal rate.  Respiratory: Effort normal and breath sounds normal. She has no decreased breath sounds.  GI: Soft. She exhibits no distension. There is no abdominal tenderness. There is no rebound and no guarding.  Gravid  Neurological: She is alert and oriented to person, place, and time.   Skin: Skin is warm and dry.  Psychiatric: She has a normal mood and affect. Her behavior is normal. Judgment and thought content normal.    Prenatal labs: ABO, Rh: O/Positive/-- (06/26 1012) Antibody: Negative (06/26 1012) Rubella: 5.68 (06/26 1012) RPR: Non Reactive (11/03 0913)  HBsAg: Negative (06/26 1012)  HIV: Non Reactive (11/03 0913)  GBS:   POS  Fetal Surveillance: --Category I tracing --Baseline 150, moderate variability, positive accelerations, no decelerations --Toco rare contractions --BPP 8/8 less than 24 hours ago --EFW 79% (3540g) as of [redacted]w[redacted]d  Assessment/Plan: --23 y.o. G4P1021 at [redacted]w[redacted]d  --Category I tracing --GBS + --Cervical exam deferred to labor team  Gestational Hypertension --Criteria for diagnosis met as of 1708 --Mild elevation, no severe range or severe symptoms --Normal PEC labs  Anemia --Feraheme given in MAU prior to admission for Hgb 9.1  Boy/no circ/breast/Nuvaring  Samantha C Weinhold, CNM 03/06/2019, 6:27 PM    

## 2019-03-06 NOTE — Progress Notes (Signed)
LABOR PROGRESS NOTE  Brandi Norris is a 24 y.o. H4L9379 at [redacted]w[redacted]d  admitted for IOL for gHTN.  Subjective: Comfortable  Objective: BP 129/73   Pulse 86   Temp 98.6 F (37 C) (Oral)   Resp 18   Ht 5\' 4"  (1.626 m)   Wt 118 kg   LMP 06/12/2018   SpO2 100%   BMI 44.66 kg/m  or  Vitals:   03/06/19 1745 03/06/19 1748 03/06/19 1837 03/06/19 1838  BP:  111/76 134/86 129/73  Pulse:  93 94 86  Resp:  18    Temp:  98.6 F (37 C)    TempSrc:  Oral    SpO2: 100% 100%    Weight:      Height:        Internal os closed Exam by:: mateo Arienne Gartin, md FHT: baseline rate 140, moderate varibility, +acel, -decel Toco: poo tracing  Labs: Lab Results  Component Value Date   WBC 10.2 03/06/2019   HGB 9.1 (L) 03/06/2019   HCT 29.1 (L) 03/06/2019   MCV 71.3 (L) 03/06/2019   PLT 277 03/06/2019    Patient Active Problem List   Diagnosis Date Noted  . Pregnant 03/06/2019  . GBS bacteriuria 02/07/2019  . Pyelectasis of fetus on prenatal ultrasound 02/07/2019  . Supervision of other normal pregnancy, antepartum 08/13/2018  . History of gestational hypertension 08/13/2018  . Alpha thalassemia trait 03/02/2017  . Morbid obesity (HCC) 12/05/2016    Assessment / Plan: 24 y.o. 25 at [redacted]w[redacted]d here for IOL for gHTN.  Labor: internal os closed, start IOL with misoprostol, attempt FB at next check Fetal Wellbeing:  Cat I Pain Control:  IV pain meds PRN, epidural upon request GBS: positive, penicillin ordered Anticipated MOD:  SVD  gHTN: mostly normotensive since arrival, asymptomatic, ctm  [redacted]w[redacted]d, MD/[facility] OB Fellow  03/06/2019, 8:21 PM

## 2019-03-07 ENCOUNTER — Inpatient Hospital Stay (HOSPITAL_COMMUNITY): Payer: Medicaid Other | Admitting: Anesthesiology

## 2019-03-07 ENCOUNTER — Encounter (HOSPITAL_COMMUNITY): Payer: Self-pay

## 2019-03-07 ENCOUNTER — Encounter: Payer: Medicaid Other | Admitting: Obstetrics & Gynecology

## 2019-03-07 LAB — CULTURE, OB URINE: Culture: NO GROWTH

## 2019-03-07 LAB — CBC
HCT: 28.5 % — ABNORMAL LOW (ref 36.0–46.0)
Hemoglobin: 8.7 g/dL — ABNORMAL LOW (ref 12.0–15.0)
MCH: 22.2 pg — ABNORMAL LOW (ref 26.0–34.0)
MCHC: 30.5 g/dL (ref 30.0–36.0)
MCV: 72.7 fL — ABNORMAL LOW (ref 80.0–100.0)
Platelets: 291 10*3/uL (ref 150–400)
RBC: 3.92 MIL/uL (ref 3.87–5.11)
RDW: 14.4 % (ref 11.5–15.5)
WBC: 12.4 10*3/uL — ABNORMAL HIGH (ref 4.0–10.5)
nRBC: 0 % (ref 0.0–0.2)

## 2019-03-07 LAB — RPR: RPR Ser Ql: NONREACTIVE

## 2019-03-07 MED ORDER — DIPHENHYDRAMINE HCL 50 MG/ML IJ SOLN: 12.5000 mg | INTRAMUSCULAR | Status: DC | PRN

## 2019-03-07 MED ORDER — BENZOCAINE-MENTHOL 20-0.5 % EX AERO
1.0000 "application " | INHALATION_SPRAY | CUTANEOUS | Status: DC | PRN
  Administered 2019-03-07 – 2019-03-09 (×2): 1 via TOPICAL
  Filled 2019-03-07 (×2): qty 56

## 2019-03-07 MED ORDER — LACTATED RINGERS IV SOLN: 500.0000 mL | Freq: Once | INTRAVENOUS | Status: DC

## 2019-03-07 MED ORDER — PHENYLEPHRINE 40 MCG/ML (10ML) SYRINGE FOR IV PUSH (FOR BLOOD PRESSURE SUPPORT)
80.0000 ug | PREFILLED_SYRINGE | INTRAVENOUS | Status: DC | PRN
  Filled 2019-03-07: qty 10

## 2019-03-07 MED ORDER — OXYTOCIN 40 UNITS IN NORMAL SALINE INFUSION - SIMPLE MED
1.0000 m[IU]/min | INTRAVENOUS | Status: DC
  Administered 2019-03-07: 2 m[IU]/min via INTRAVENOUS

## 2019-03-07 MED ORDER — PHENYLEPHRINE 40 MCG/ML (10ML) SYRINGE FOR IV PUSH (FOR BLOOD PRESSURE SUPPORT): 80.0000 ug | PREFILLED_SYRINGE | INTRAVENOUS | Status: DC | PRN

## 2019-03-07 MED ORDER — TETANUS-DIPHTH-ACELL PERTUSSIS 5-2.5-18.5 LF-MCG/0.5 IM SUSP: 0.5000 mL | Freq: Once | INTRAMUSCULAR | Status: DC

## 2019-03-07 MED ORDER — TERBUTALINE SULFATE 1 MG/ML IJ SOLN: 0.2500 mg | Freq: Once | INTRAMUSCULAR | Status: DC | PRN

## 2019-03-07 MED ORDER — SODIUM BICARBONATE 8.4 % IV SOLN
INTRAVENOUS | Status: DC | PRN
  Administered 2019-03-07: 5 mL via EPIDURAL

## 2019-03-07 MED ORDER — PRENATAL MULTIVITAMIN CH
1.0000 | ORAL_TABLET | Freq: Every day | ORAL | Status: DC
  Administered 2019-03-08 – 2019-03-09 (×2): 1 via ORAL
  Filled 2019-03-07 (×2): qty 1

## 2019-03-07 MED ORDER — DIBUCAINE (PERIANAL) 1 % EX OINT
1.0000 "application " | TOPICAL_OINTMENT | CUTANEOUS | Status: DC | PRN
  Filled 2019-03-07: qty 28

## 2019-03-07 MED ORDER — DIPHENHYDRAMINE HCL 25 MG PO CAPS: 25.0000 mg | ORAL_CAPSULE | Freq: Four times a day (QID) | ORAL | Status: DC | PRN

## 2019-03-07 MED ORDER — CALCIUM CARBONATE ANTACID 500 MG PO CHEW: 3.0000 | CHEWABLE_TABLET | ORAL | Status: DC | PRN

## 2019-03-07 MED ORDER — COCONUT OIL OIL
1.0000 "application " | TOPICAL_OIL | Status: DC | PRN
  Administered 2019-03-08: 1 via TOPICAL

## 2019-03-07 MED ORDER — SODIUM CHLORIDE (PF) 0.9 % IJ SOLN
INTRAMUSCULAR | Status: DC | PRN
  Administered 2019-03-07: 12 mL/h via EPIDURAL

## 2019-03-07 MED ORDER — EPHEDRINE 5 MG/ML INJ: 10.0000 mg | INTRAVENOUS | Status: DC | PRN

## 2019-03-07 MED ORDER — ACETAMINOPHEN 325 MG PO TABS: 650.0000 mg | ORAL_TABLET | Freq: Every day | ORAL | Status: DC | PRN

## 2019-03-07 MED ORDER — SENNOSIDES-DOCUSATE SODIUM 8.6-50 MG PO TABS
2.0000 | ORAL_TABLET | ORAL | Status: DC
  Administered 2019-03-08 – 2019-03-09 (×2): 2 via ORAL
  Filled 2019-03-07 (×2): qty 2

## 2019-03-07 MED ORDER — WITCH HAZEL-GLYCERIN EX PADS
1.0000 "application " | MEDICATED_PAD | CUTANEOUS | Status: DC | PRN
  Administered 2019-03-08 – 2019-03-09 (×2): 1 via TOPICAL

## 2019-03-07 MED ORDER — FENTANYL-BUPIVACAINE-NACL 0.5-0.125-0.9 MG/250ML-% EP SOLN: 12.0000 mL/h | EPIDURAL | Status: DC | PRN

## 2019-03-07 MED ORDER — SIMETHICONE 80 MG PO CHEW: 80.0000 mg | CHEWABLE_TABLET | ORAL | Status: DC | PRN

## 2019-03-07 MED ORDER — FENTANYL-BUPIVACAINE-NACL 0.5-0.125-0.9 MG/250ML-% EP SOLN
12.0000 mL/h | EPIDURAL | Status: DC | PRN
  Filled 2019-03-07: qty 250

## 2019-03-07 MED ORDER — ONDANSETRON HCL 4 MG PO TABS: 4.0000 mg | ORAL_TABLET | ORAL | Status: DC | PRN

## 2019-03-07 MED ORDER — IBUPROFEN 600 MG PO TABS
600.0000 mg | ORAL_TABLET | Freq: Four times a day (QID) | ORAL | Status: DC
  Administered 2019-03-07 – 2019-03-09 (×7): 600 mg via ORAL
  Filled 2019-03-07 (×7): qty 1

## 2019-03-07 MED ORDER — ONDANSETRON HCL 4 MG/2ML IJ SOLN: 4.0000 mg | INTRAMUSCULAR | Status: DC | PRN

## 2019-03-07 NOTE — Progress Notes (Signed)
LABOR PROGRESS NOTE  Brandi Norris is a 24 y.o. E1V4715 at [redacted]w[redacted]d  admitted for IOL for gHTN.  Subjective: Feeling ctx and rating 7/10.  Objective: BP 105/62   Pulse 90   Temp 98.7 F (37.1 C) (Oral)   Resp 18   Ht 5\' 4"  (1.626 m)   Wt 118 kg   LMP 06/12/2018   SpO2 100%   BMI 44.66 kg/m  or  Vitals:   03/06/19 2205 03/06/19 2247 03/07/19 0103 03/07/19 0106  BP: (!) 129/91 122/71 (!) 96/55 105/62  Pulse: (!) 103 89  90  Resp: 19 18  18   Temp:    98.7 F (37.1 C)  TempSrc:    Oral  SpO2:      Weight:      Height:        Internal os closed Dilation: 1 Station: -3 Presentation: Vertex Exam by:: , RN FHT: baseline rate 145, moderate varibility, +acel, -decel Toco: q2-3  Labs: Lab Results  Component Value Date   WBC 10.2 03/06/2019   HGB 9.1 (L) 03/06/2019   HCT 29.1 (L) 03/06/2019   MCV 71.3 (L) 03/06/2019   PLT 277 03/06/2019    Patient Active Problem List   Diagnosis Date Noted  . Pregnant 03/06/2019  . Gestational hyperglycemia 03/06/2019  . GBS bacteriuria 02/07/2019  . Pyelectasis of fetus on prenatal ultrasound 02/07/2019  . Supervision of other normal pregnancy, antepartum 08/13/2018  . History of gestational hypertension 08/13/2018  . Alpha thalassemia trait 03/02/2017  . Morbid obesity (HCC) 12/05/2016    Assessment / Plan: 24 y.o. 12/07/2016 at [redacted]w[redacted]d here for IOL for gHTN.  Labor: Vertex by BSUS. S/p Cytotec x2. FB placed at this exam. AROM/Pit as indicated when FB out. Anticipate eventual SVD. Fetal Wellbeing:  Cat I Pain Control:  IV pain meds PRN, epidural upon request GBS: positive, penicillin  Anticipated MOD:  SVD  gHTN: mostly normotensive since arrival, asymptomatic, ctm  [id number], MD [facility] Family Medicine Fellow, [facility] for [facility] 03/07/2019, 3:07 AM

## 2019-03-07 NOTE — Anesthesia Preprocedure Evaluation (Signed)
Anesthesia Evaluation  Patient identified by MRN, date of birth, ID band Patient awake    Reviewed: Allergy & Precautions, Patient's Chart, lab work & pertinent test results  Airway Mallampati: II       Dental no notable dental hx.    Pulmonary    Pulmonary exam normal        Cardiovascular hypertension, Normal cardiovascular exam     Neuro/Psych negative neurological ROS  negative psych ROS   GI/Hepatic negative GI ROS, Neg liver ROS,   Endo/Other    Renal/GU      Musculoskeletal   Abdominal (+) + obese,   Peds  Hematology negative hematology ROS (+)   Anesthesia Other Findings   Reproductive/Obstetrics (+) Pregnancy                             Anesthesia Physical Anesthesia Plan  ASA: III  Anesthesia Plan: Epidural   Post-op Pain Management:    Induction:   PONV Risk Score and Plan:   Airway Management Planned: Natural Airway  Additional Equipment: None  Intra-op Plan:   Post-operative Plan:   Informed Consent: I have reviewed the patients History and Physical, chart, labs and discussed the procedure including the risks, benefits and alternatives for the proposed anesthesia with the patient or authorized representative who has indicated his/her understanding and acceptance.       Plan Discussed with:   Anesthesia Plan Comments: (Lab Results      Component                Value               Date                      WBC                      12.4 (H)            03/07/2019                HGB                      8.7 (L)             03/07/2019                HCT                      28.5 (L)            03/07/2019                MCV                      72.7 (L)            03/07/2019                PLT                      291                 03/07/2019           )        Anesthesia Quick Evaluation

## 2019-03-07 NOTE — Progress Notes (Addendum)
Brandi Norris is a 24 y.o. [id number] at [redacted]w[redacted]d admitted for IOL for gHTN.   Subjective: Patient's pain is much better controlled following epidural placement. She describes some mild cramping around her "booty-hole," but is not feeling any contractions. She denies any LOF, and feels baby moving.   Objective: BP 131/71   Pulse 90   Temp 98.3 F (36.8 C)   Resp 16   Ht 5\' 4"  (1.626 m)   Wt 118 kg   LMP 06/12/2018   SpO2 100%   BMI 44.66 kg/m  No intake/output data recorded.  FHT:  FHR: 140 bpm, variability: moderate,  accelerations: absent,  decelerations:  Absent UC:   irregular, every 2-3 minutes  SVE:   Dilation: 4.5 Effacement (%): 80 Station: -3 Exam by:: lee(lee)  Pitocin @ 4 mu/min  Labs: Lab Results  Component Value Date   WBC 12.4 (H) 03/07/2019   HGB 8.7 (L) 03/07/2019   HCT 28.5 (L) 03/07/2019   MCV 72.7 (L) 03/07/2019   PLT 291 03/07/2019    Assessment / Plan: Brandi Norris is a 24 y.o. 25 at [redacted]w[redacted]d admitted for IOL for gHTN.  Labor: S/p Cytotec x3. S/p FB. Pit started 1036 now at 4 mu/min, progressing well, AROM if indicated  GHTN: normotensive, continue monitoring  Fetal Wellbeing:  Category I Pain Control:  Epidural and IV pain meds I/D:  GBS+, PCN Anticipated MOD:  Vaginal Delivery   [redacted]w[redacted]d, MS3  03/07/2019, 12:13 PM  I confirm that I have verified the information documented in the medical student's note and that I have also personally reperformed the history, physical exam and all medical decision making activities of this service and have verified that all service and findings are accurately documented in this student's note.    03/09/2019, CNM 03/07/2019 12:56 PM

## 2019-03-07 NOTE — Progress Notes (Addendum)
Brandi Norris is a 24 y.o. [id number] at [redacted]w[redacted]d admitted for IOL for gHTN.  Subjective: Patient's pain is well controlled. She describes continued pressure like "she has to poop."   Objective: BP 125/74   Pulse (!) 105   Temp 98.7 F (37.1 C) (Oral)   Resp 18   Ht 5\' 4"  (1.626 m)   Wt 118 kg   LMP 06/12/2018   SpO2 100%   BMI 44.66 kg/m  No intake/output data recorded.  FHT:  FHR: 140 bpm, variability: moderate,  accelerations:  Present,  decelerations:  Absent UC:   irregular, every 2-3 minutes  SVE:   Dilation: 6 Effacement (%): 50 Station: -3 Exam by:: lee Pitocin @ 8 mu/min  Labs: Lab Results  Component Value Date   WBC 12.4 (H) 03/07/2019   HGB 8.7 (L) 03/07/2019   HCT 28.5 (L) 03/07/2019   MCV 72.7 (L) 03/07/2019   PLT 291 03/07/2019   Assessment / Plan: Brandi Norris is a 24 yo G4P1021 at [redacted]w[redacted]d admitted for IOL for gHTN progressing well on pitocin.   Labor: S/p cytotec x3. S/p FB. Pit started 1036, now at 8 mu/min. SROM 1358. Continue up-titrating pitocin. IUPC as indicated.  GHTN: normotensive, continue monitoring  Fetal Wellbeing:  Category I Pain Control:  Epidural and IV pain meds I/D:  GBS+, PCN Anticipated MOD:  Vaginal Delivery   [redacted]w[redacted]d, MS3   03/07/2019, 5:03 PM  I confirm that I have verified the information documented in the medical student's note and that I have also personally reperformed the history, physical exam and all medical decision making activities of this service and have verified that all service and findings are accurately documented in this student's note.    03/09/2019, CNM 03/07/2019 5:25 PM

## 2019-03-07 NOTE — Progress Notes (Signed)
LABOR PROGRESS NOTE  Brandi Norris is a 24 y.o. [id number] at [redacted]w[redacted]d  admitted for IOL for gHTN.  Subjective: Feeling ctx; FB out.  Objective: BP 112/82   Pulse 82   Temp 98.7 F (37.1 C) (Oral)   Resp 18   Ht 5\' 4"  (1.626 m)   Wt 118 kg   LMP 06/12/2018   SpO2 100%   BMI 44.66 kg/m  or  Vitals:   03/07/19 0103 03/07/19 0106 03/07/19 0310 03/07/19 0353  BP: (!) 96/55 105/62 136/90 112/82  Pulse:  90 87 82  Resp:  18  18  Temp:  98.7 F (37.1 C)    TempSrc:  Oral    SpO2:      Weight:      Height:        Internal os closed Dilation: 4 Effacement (%): 50 Station: -3 Presentation: Vertex Exam by:: [email or web address], RN FHT: baseline rate 145, moderate varibility, +acel, -decel Toco: q2-3  Labs: Lab Results  Component Value Date   WBC 10.2 03/06/2019   HGB 9.1 (L) 03/06/2019   HCT 29.1 (L) 03/06/2019   MCV 71.3 (L) 03/06/2019   PLT 277 03/06/2019    Patient Active Problem List   Diagnosis Date Noted  . Pregnant 03/06/2019  . Gestational hyperglycemia 03/06/2019  . GBS bacteriuria 02/07/2019  . Pyelectasis of fetus on prenatal ultrasound 02/07/2019  . Supervision of other normal pregnancy, antepartum 08/13/2018  . History of gestational hypertension 08/13/2018  . Alpha thalassemia trait 03/02/2017  . Morbid obesity (HCC) 12/05/2016    Assessment / Plan: 24 y.o. 25 at [redacted]w[redacted]d here for IOL for gHTN.  Labor: Vertex by BSUS. S/p Cytotec x2. S/p FB. Due to thickness, will plan to give one more Cytotec and then likely Pit thereafter. Anticipate eventual SVD. Fetal Wellbeing:  Cat I Pain Control:  IV pain meds PRN, epidural upon request GBS: positive, penicillin  Anticipated MOD:  SVD  gHTN: mostly normotensive since arrival, asymptomatic, ctm  [redacted]w[redacted]d, MD [facility] Family [facility] Fellow, [facility] for [facility] 03/07/2019, 4:54 AM

## 2019-03-07 NOTE — Discharge Summary (Signed)
Postpartum Discharge Summary      Patient Name: Brandi Norris DOB: 1996/01/02 MRN: [id number]  Date of admission: 03/06/2019 Delivering Provider: Julianne Handler   Date of discharge: 03/09/2019  Admitting diagnosis: Pregnant [Z34.90] Intrauterine pregnancy: [redacted]w[redacted]d    Secondary diagnosis:  Active Problems:   Morbid obesity ([place]   History of gestational hypertension   GBS bacteriuria   Pregnant   SVD (spontaneous vaginal delivery)  Additional problems: gHTN     Discharge diagnosis: Term Pregnancy Delivered                                                                                                Post partum procedures: None  Augmentation: Pitocin, Cytotec and Foley Balloon  Complications: None  Hospital course:  Induction of Labor With Vaginal Delivery   24y.o. yo [id number] [id number] admitted to the hospital 03/06/2019 for induction of labor.  Indication for induction: Gestational hypertension.  Patient had an uncomplicated labor course as follows: Membrane Rupture Time/Date: 1:58 PM ,03/07/2019   Intrapartum Procedures: Episiotomy: None [1]                                         Lacerations:  1st degree [2];Perineal [11]  Patient had delivery of a Viable infant.  Information for the patient's newborn:  Brandi, Keeling0[[id number]]Delivery Method: Vag-Spont    03/07/2019  Details of delivery can be found in separate delivery note.  Patient had a routine postpartum course. Patient is discharged home 03/09/19. Delivery time: 7:09 PM    Magnesium Sulfate received: No BMZ received: No Rhophylac:N/A MMR:N/A Transfusion:No  Physical exam  Vitals:   03/08/19 1010 03/08/19 1507 03/08/19 2133 03/09/19 0527  BP: (!) 139/95 125/84 116/63 125/88  Pulse: 75 67 86 93  Resp: '18 18  18  '$ Temp: 98.2 F (36.8 C) 98.2 F (36.8 C) 98.3 F (36.8 C) 97.9 F (36.6 C)  TempSrc: Oral Oral  Axillary  SpO2: 100% 100%  99%  Weight:      Height:       General: alert,  cooperative and no distress Lochia: appropriate Uterine Fundus: firm Incision: N/A DVT Evaluation: No evidence of DVT seen on physical exam. Labs: Lab Results  Component Value Date   WBC 12.4 (H) 03/07/2019   HGB 8.7 (L) 03/07/2019   HCT 28.5 (L) 03/07/2019   MCV 72.7 (L) 03/07/2019   PLT 291 03/07/2019   CMP Latest Ref Rng & Units 03/06/2019  Glucose 70 - 99 mg/dL 82  BUN 6 - 20 mg/dL 5(L)  Creatinine 0.44 - 1.00 mg/dL 0.67  Sodium 135 - 145 mmol/L 137  Potassium 3.5 - 5.1 mmol/L 3.6  Chloride 98 - 111 mmol/L 108  CO2 22 - 32 mmol/L 21(L)  Calcium 8.9 - 10.3 mg/dL 8.9  Total Protein 6.5 - 8.1 g/dL 6.7  Total Bilirubin 0.3 - 1.2 mg/dL 0.6  Alkaline Phos 38 - 126 U/L 111  AST 15 - 41 U/L 17  ALT 0 - 44 U/L 10    Discharge instruction: per After Visit Summary and "Baby and Me Booklet".  After visit meds:  Allergies as of 03/09/2019      Reactions   Mustard [allyl Isothiocyanate] Hives      Medication List    TAKE these medications   AMBULATORY NON FORMULARY MEDICATION 1 Device by Other route once a week. Blood pressure cuff for monitored regularly at home  ICD 10: O09.90   calcium carbonate 500 MG chewable tablet Commonly known as: TUMS - dosed in mg elemental calcium Chew 3 tablets by mouth as needed for indigestion or heartburn.   ibuprofen 600 MG tablet Commonly known as: ADVIL Take 1 tablet (600 mg total) by mouth every 6 (six) hours.   Tylenol 325 MG tablet Generic drug: acetaminophen Take 650 mg by mouth daily as needed for mild pain (pain).   Vitafol Gummies 3.33-0.333-34.8 MG Chew Chew 34.8 mg by mouth 3 (three) times daily.       Diet: routine diet  Activity: Advance as tolerated. Pelvic rest for 6 weeks.   Outpatient follow up:6 weeks Follow up Appt: Future Appointments  Date Time Provider [place]  03/15/2019  9:15 AM [place]  04/08/2019 10:30 AM Truett Mainland, DO [facility] None   Follow up  Visit:  Please schedule this patient for Postpartum visit in: 6 weeks with the following provider: Any provider Virtual For C/S patients schedule nurse incision check in weeks 2 weeks: no High risk pregnancy complicated by: HTN Delivery mode:  SVD Anticipated Birth Control:  ring PP Procedures needed: BP check  Schedule Integrated BH visit: no  Newborn Data: Live born female  Birth Weight:  3759 g APGAR: 80, 9  Newborn Delivery   Birth date/time: 03/07/2019 19:09:00 Delivery type: Vaginal, Spontaneous      Baby Feeding: Bottle and Breast Disposition:home with mother   03/09/2019 Merilyn Baba, DO

## 2019-03-07 NOTE — Anesthesia Procedure Notes (Signed)
Epidural Patient location during procedure: OB Start time: 03/07/2019 10:12 AM End time: 03/07/2019 10:18 AM  Staffing Anesthesiologist: Shelton Silvas, MD Performed: anesthesiologist   Preanesthetic Checklist Completed: patient identified, IV checked, site marked, risks and benefits discussed, surgical consent, monitors and equipment checked, pre-op evaluation and timeout performed  Epidural Patient position: sitting Prep: ChloraPrep Patient monitoring: heart rate, continuous pulse ox and blood pressure Approach: midline Location: L3-L4 Injection technique: LOR saline  Needle:  Needle type: Tuohy  Needle gauge: 17 G Needle length: 9 cm Catheter type: closed end flexible Catheter size: 20 Guage Test dose: negative and 1.5% lidocaine  Assessment Events: blood not aspirated, injection not painful, no injection resistance and no paresthesia  Additional Notes LOR @ 5.5  Patient identified. Risks/Benefits/Options discussed with patient including but not limited to bleeding, infection, nerve damage, paralysis, failed block, incomplete pain control, headache, blood pressure changes, nausea, vomiting, reactions to medications, itching and postpartum back pain. Confirmed with bedside nurse the patient's most recent platelet count. Confirmed with patient that they are not currently taking any anticoagulation, have any bleeding history or any family history of bleeding disorders. Patient expressed understanding and wished to proceed. All questions were answered. Sterile technique was used throughout the entire procedure. Please see nursing notes for vital signs. Test dose was given through epidural catheter and negative prior to continuing to dose epidural or start infusion. Warning signs of high block given to the patient including shortness of breath, tingling/numbness in hands, complete motor block, or any concerning symptoms with instructions to call for help. Patient was given instructions  on fall risk and not to get out of bed. All questions and concerns addressed with instructions to call with any issues or inadequate analgesia.    Reason for block:procedure for pain

## 2019-03-07 NOTE — Plan of Care (Signed)
L&d care plan complete 

## 2019-03-08 NOTE — Lactation Note (Addendum)
This note was copied from a baby's chart. Lactation Consultation Note  Patient Name: Boy Vauda Salvucci JIRCV'E Date: 03/08/2019 Reason for consult: Follow-up assessment;Early term 1-38.6wks  P2 mother whose infant is now 45 hours old.  This is an ETI at 38+2 weeks.  Mother breast fed her first child (now 72 months old) for 2 months.  She informed me that she started taking Fenugreek and it "dried up her milk."  Baby is Coombs+.  Baby was swaddled and asleep when I arrived.  Baby has been spitty and mother described it as a lot in the beginning(size of a half dollar amount)  but not so much at this time.  When asked about the color, mother stated it was yellow green.  RN and NP aware.  Offered to awaken and assist with latching and mother accepted.  Discussed the ETI, Coombs positive status and the need to try to awaken baby for feeding.  Unswaddled baby and gentle stimulation used to help awaken him.  Asked mother to express colostrum drops but she was unable to express any at this time.  Perfected her technique but still no drops available.  Allowed baby to suck on my gloved finger to assess suck and help awaken.  His suck was weak and uncoordinated.  Worked with him for approximately 10 minutes and he began to awaken and suck effectively; no biting noted.  Sat him up and burped multiple times.  Demonstrated stimulation, burping and rubbing up/down his spine to help awaken.  When baby began to awaken assisted to latch to the left breast after two attempts.  Baby needed constant gentle stimulation to initiate sucking.  Once he began to suck mother felt a tug and denied pain.  Constant stimulation continued in order to keep him awake.  Halfway through I sat him up again to awaken and burp; multiple burps noted and no emesis at this time.  Latched again easily and baby continued sucking on/off for a total of 15 minutes.  Father at bedside and educated him on how he will need to assist mother until baby  awakens for longer periods of time.  Father willing to help.  Encouraged mother to awaken him at least every three hours to attempt feeding.  She will continue hand expression before/after feedings and feed back any EBM she obtains to baby.  Due to short shafted nipples I asked her to wear her breast shells in between feedings and to use her manual pump prior to latching with every feeding. Demonstrated hand pump and mother did a return demonstration. Mother's nipple responds to pumping and everted slightly.  Breasts are soft and non tender and compressible.  Mother will post pump after every feeding and include breast massage for more stimulation.    Explained to mother that she will probably have to begin supplementing if she is unable to obtain colostrum by 24 hours of age.  Explained jaundice and mother aware of the importance of feeding and supplementation.  Informed her of our donor breast milk program and mother is interested if needed.  RN updated and will continue to monitor.     Maternal Data Formula Feeding for Exclusion: No Has patient been taught Hand Expression?: Yes Does the patient have breastfeeding experience prior to this delivery?: Yes  Feeding Feeding Type: Breast Fed  LATCH Score Latch: Repeated attempts needed to sustain latch, nipple held in mouth throughout feeding, stimulation needed to elicit sucking reflex.  Audible Swallowing: None  Type of Nipple: Everted  at rest and after stimulation(short shafted)  Comfort (Breast/Nipple): Soft / non-tender  Hold (Positioning): Assistance needed to correctly position infant at breast and maintain latch.  LATCH Score: 6  Interventions Interventions: Breast feeding basics reviewed;Assisted with latch;Skin to skin;Breast massage;Hand express;Pre-pump if needed;Breast compression;Adjust position;DEBP;Hand pump;Shells;Position options;Support pillows  Lactation Tools Discussed/Used Tools: Shells;Pump Shell Type:  Inverted Breast pump type: Double-Electric Breast Pump;Manual Pump Review: Setup, frequency, and cleaning(reviewed)   Consult Status Consult Status: Follow-up Date: 03/09/19 Follow-up type: In-patient    Kathia Covington R Ronie Fleeger 03/08/2019, 2:14 PM

## 2019-03-08 NOTE — Progress Notes (Signed)
POSTPARTUM PROGRESS NOTE  Subjective: Brandi Norris is a 24 y.o. [id number] on PPD#1 s/p vaginal delivery at [redacted]w[redacted]d.  She reports she doing well. No acute events overnight. She denies any problems with ambulating, voiding or po intake. Denies nausea or vomiting. She has passed flatus. Pain is well controlled.  Lochia is appropriate.  Objective: Blood pressure (!) 156/88, pulse (!) 101, temperature 97.9 F (36.6 C), temperature source Oral, resp. rate 18, height 5\' 4"  (1.626 m), weight 118 kg, last menstrual period 06/12/2018, SpO2 100 %, unknown if currently breastfeeding.  Physical Exam:  General: alert, cooperative and no distress Chest: no respiratory distress Abdomen: soft, non-tender  Uterine Fundus: firm, appropriately tender Extremities: No calf swelling or tenderness  No edema  Recent Labs    03/06/19 1529 03/07/19 0932  HGB 9.1* 8.7*  HCT 29.1* 28.5*    Assessment/Plan: Brandi Norris is a 24 y.o. 25 PPD#1 s/p vaginal delivery at [redacted]w[redacted]d.  Routine Postpartum Care: Doing well, pain well-controlled.  -- Continue routine care, lactation support  -- Contraception: discussed progesterone-based options as she is trying to breastfeed -- Feeding: breast  Dispo: Plan for discharge tomorrow.  [redacted]w[redacted]d, DO OB/GYN Fellow, [facility] for [facility]

## 2019-03-08 NOTE — Anesthesia Postprocedure Evaluation (Signed)
Anesthesia Post Note  Patient: Brandi Norris  Procedure(s) Performed: AN AD HOC LABOR EPIDURAL     Patient location during evaluation: Mother Baby Anesthesia Type: Epidural Level of consciousness: awake, awake and alert and oriented Pain management: pain level controlled Vital Signs Assessment: post-procedure vital signs reviewed and stable Respiratory status: spontaneous breathing Cardiovascular status: stable Postop Assessment: no headache, patient able to bend at knees, no apparent nausea or vomiting, adequate PO intake, able to ambulate and no backache Anesthetic complications: no    Last Vitals:  Vitals:   03/08/19 0221 03/08/19 0631  BP: 138/86 (!) 156/88  Pulse: 86 (!) 101  Resp: 18 18  Temp: 36.6 C 36.6 C  SpO2: 100% 100%    Last Pain:  Vitals:   03/08/19 0631  TempSrc: Oral  PainSc: 4    Pain Goal:                   Bryn Perkin

## 2019-03-08 NOTE — Progress Notes (Signed)
CSW received consult due to score 10 on [place] Depression Screen.    CSW spoke with MOB at bedside to offer further support. CSW introduced role and the reason for the visit. MOB reported that she has a lot of childhood trauma which in turn resulted in her mental health diagnosis. MOB reports to CSW that she was diagnosed with PTSD as a child. CSW was advised by MOB that she was placed in therapy and on medications. MOB reports that she hasn't been taking any medications recently or in therapy. MOB reports that she was diagnosed in 2017 with depression. MOB reports that she hasn't had any signs or symptom so depression recently but when she does have them "I spend time with my son and just take some time to be alone". CSW suggested that if Mob ever starts feel like she is isolating herself then she should reach out for help. MOB went on to tell CSW that she felt SI at one point in her pregnancy as her and Fib were dealing with some things. Mob was very open and forth coming in telling CSW what took place between she and FOB. MOB reports that this time, she and FOB are working on things and things have been well. CSW provided MOB with other possible supports that can help MOB and FOB in what they are dealing with. MOB denies SI and HI at this time and expressed that she is feeling happy but tired.   MOB reports that she has all needed items to care for infant with no other needs.    CSW provided education regarding Baby Blues vs PMADs and provided MOB with resources for mental health follow up.  CSW encouraged MOB to evaluate her mental health throughout the postpartum period with the use of the New Mom Checklist developed by Postpartum [employer] as well as the [place] Postnatal Depression Scale and notify a medical professional if symptoms arise.       Claude Manges Teonia Yager, MSW, LCSW [facility] [phone]

## 2019-03-08 NOTE — Lactation Note (Signed)
This note was copied from a baby's chart. Lactation Consultation Note  Patient Name: Brandi Norris [id number] Date: 03/08/2019 Reason for consult: Follow-up assessment;Early term 37-38.6wks  LC Follow Up:  No donor breast milk will be available until tomorrow a.m.; informed by Director of Lactation and Perinatal Services.  RN informed.     Maternal Data Formula Feeding for Exclusion: No Has patient been taught Hand Expression?: Yes Does the patient have breastfeeding experience prior to this delivery?: Yes  Feeding Feeding Type: Breast Fed  LATCH Score Latch: Repeated attempts needed to sustain latch, nipple held in mouth throughout feeding, stimulation needed to elicit sucking reflex.  Audible Swallowing: None  Type of Nipple: Everted at rest and after stimulation(short shafted)  Comfort (Breast/Nipple): Soft / non-tender  Hold (Positioning): Assistance needed to correctly position infant at breast and maintain latch.  LATCH Score: 6  Interventions Interventions: Breast feeding basics reviewed;Assisted with latch;Skin to skin;Breast massage;Hand express;Pre-pump if needed;Breast compression;Adjust position;DEBP;Hand pump;Shells;Position options;Support pillows  Lactation Tools Discussed/Used Tools: Shells;Pump Shell Type: Inverted Breast pump type: Double-Electric Breast Pump;Manual Pump Review: Setup, frequency, and cleaning(reviewed)   Consult Status Consult Status: Follow-up Date: 03/09/19 Follow-up type: In-patient    [name] [name] 03/08/2019, 4:00 PM

## 2019-03-08 NOTE — Lactation Note (Signed)
This note was copied from a baby's chart. Lactation Consultation Note Baby 5 hrs old. Mom states needing assistance latching. Mom has pendulous large soft breast w/short shaft compressible nipples. Mom states baby is sleepy, has cued but unable to latch. Mom states she had difficulty BF her 1st child now 34 months old. Mom stated she pumped, breast, and bottle fed as well having to give formula d/t her milk dried up at 2 months. Mom stated she started taking Mother's Milk and Fenugreek. Mom stated WIC told her that the Fenugreek made her milk dry up.  Mom stated on admission she is breast/formula, stated to [facility] she really wants to just breast feed. But if she has to she will give formula because she wants her baby to eat. LC discussed setting up DEBP and mom pumping for extra stimulation. Mom agreed.  Mom shown how to use DEBP & how to disassemble, clean, & reassemble parts. Instructed mom if she is pumping for extra stimulation she can pump 5-6 times a day, if she is pumping for supplementation and to get her milk in faster she can pump every 3 hrs after BF. Mom agreed. Mom pumped before LC left consult. Mom encouraged to feed baby 8-12 times/24 hours and with feeding cues.  Mom encouraged to waken baby for feeds if hasn't cued in 3 hrs.   LC checked diaper and stimulated baby to wake for feeding. Mom stated football was her favor feeding position.  Worked w/mom on hand positioning on breast. Encouraging mom not to have fingers on areola near nipple so baby can get a deeper latch.  Baby latched well after several attempts. Baby had weak suckle at first then finally suckled well. Baby BF well for approx. 12 minutes. Was on breast over 20 minutes. No swallows heard.  LC attempted to hand express. No colostrum noted. Mom stated she has seen a dot when she has hand expressed. Shells given to evert nipples more. Mom stated she is afraid her nipples are going to flatten. Mom will wear in am.  Mom  very appreciative of assistance. Noted baby's abd. Distended, attempting to spit up after feeding. Demonstrated to mom using bulb syring. Baby sleeping in bassinet when LC left. Lactation brochure given. Encouraged to call for assistance if needed.  Patient Name: Brandi Norris [id number] Date: 03/08/2019 Reason for consult: Initial assessment;Early term 37-38.6wks   Maternal Data Has patient been taught Hand Expression?: Yes Does the patient have breastfeeding experience prior to this delivery?: Yes  Feeding Feeding Type: Breast Fed  LATCH Score Latch: Repeated attempts needed to sustain latch, nipple held in mouth throughout feeding, stimulation needed to elicit sucking reflex.  Audible Swallowing: None  Type of Nipple: Everted at rest and after stimulation(short shaft)  Comfort (Breast/Nipple): Soft / non-tender  Hold (Positioning): Full assist, staff holds infant at breast  LATCH Score: 5  Interventions Interventions: Breast feeding basics reviewed;Support pillows;Assisted with latch;Position options;Skin to skin;Breast massage;Hand express;Shells;Pre-pump if needed;Hand pump;DEBP;Breast compression;Adjust position  Lactation Tools Discussed/Used Tools: Shells;Pump Shell Type: Inverted Breast pump type: Double-Electric Breast Pump;Manual WIC Program: Yes Pump Review: Setup, frequency, and cleaning;Milk Storage Initiated by:: Peri Jefferson RN IBCLC Date initiated:: 03/08/19   Consult Status Consult Status: Follow-up Date: 03/08/19 Follow-up type: In-patient    Charyl Dancer 03/08/2019, 12:42 AM

## 2019-03-09 MED ORDER — IBUPROFEN 600 MG PO TABS: 600.0000 mg | ORAL_TABLET | Freq: Four times a day (QID) | ORAL | 0 refills | Status: DC

## 2019-03-09 NOTE — Lactation Note (Addendum)
This note was copied from a baby's chart. Lactation Consultation Note:  Infant is 63 hours old. Mother reports that he breastfed during the night but the latch was very painful. Infant was last breastfed with an attempt at 5am.  Mother reports  That she has been using a NS. She has not seen any milk in the shield.   Assist mother to chair for breastfeeding. Mother has large pendulous breast . She also has short nipples. No trauma noted on nipples. Mother is able to hand express small drops of colostrum.   Infant has a tight upper lip and a thick posterior frenula.   Assist mother with tea-cup hold. Infant latched and assist mother with flanging infants lips for wide gape. Upper lip is tight and difficult to flange. Observed multiple swallows and rhythmic suckling.  Infant sustained latch for 25 mins and then offered the alternate breast.  Infant was given total of 13 ml of formula with a curved tip syringe at the breast.  Parents were given supplemental guidelines.  Advised mother to continue to breastfeed on cue and to supplement infant as needed. Mother to continue to do STS and to feed infant 8-12 or more times in 24 hours.  Discussed treatment and prevention of engorgement.  Mother has a Medela pump at home. She was advised to pump every 2-3 hours for 15 mins. Mother is single pumping  because of large breast.  Parents were given handout with Peds Specialist numbers.  Reviewed milk storage guidelines.  Mother is aware of available LC services to call for follow questions or concerns.   Patient Name: Brandi Norris [id number] Date: 03/09/2019 Reason for consult: Follow-up assessment   Maternal Data    Feeding Feeding Type: Breast Fed  LATCH Score Latch: Grasps breast easily, tongue down, lips flanged, rhythmical sucking.  Audible Swallowing: Spontaneous and intermittent  Type of Nipple: Everted at rest and after stimulation  Comfort (Breast/Nipple): Soft /  non-tender  Hold (Positioning): Assistance needed to correctly position infant at breast and maintain latch.  LATCH Score: 9  Interventions Interventions: Breast feeding basics reviewed;Assisted with latch;Skin to skin;Breast massage;Hand express;Breast compression;Adjust position;Support pillows;Position options;Shells;Comfort gels;Hand pump;DEBP  Lactation Tools Discussed/Used     Consult Status Consult Status: Follow-up    Stevan Born [facility] 03/09/2019, 10:06 AM

## 2019-03-09 NOTE — Discharge Instructions (Signed)
Postpartum Care After Vaginal Delivery This sheet gives you information about how to care for yourself from the time you deliver your baby to up to 6-12 weeks after delivery (postpartum period). Your health care provider may also give you more specific instructions. If you have problems or questions, contact your health care provider. Follow these instructions at home: Vaginal bleeding  It is normal to have vaginal bleeding (lochia) after delivery. Wear a sanitary pad for vaginal bleeding and discharge. ? During the first week after delivery, the amount and appearance of lochia is often similar to a menstrual period. ? Over the next few weeks, it will gradually decrease to a dry, yellow-brown discharge. ? For most women, lochia stops completely by 4-6 weeks after delivery. Vaginal bleeding can vary from woman to woman.  Change your sanitary pads frequently. Watch for any changes in your flow, such as: ? A sudden increase in volume. ? A change in color. ? Large blood clots.  If you pass a blood clot from your vagina, save it and call your health care provider to discuss. Do not flush blood clots down the toilet before talking with your health care provider.  Do not use tampons or douches until your health care provider says this is safe.  If you are not breastfeeding, your period should return 6-8 weeks after delivery. If you are feeding your child breast milk only (exclusive breastfeeding), your period may not return until you stop breastfeeding. Perineal care  Keep the area between the vagina and the anus (perineum) clean and dry as told by your health care provider. Use medicated pads and pain-relieving sprays and creams as directed.  If you had a cut in the perineum (episiotomy) or a tear in the vagina, check the area for signs of infection until you are healed. Check for: ? More redness, swelling, or pain. ? Fluid or blood coming from the cut or tear. ? Warmth. ? Pus or a bad  smell.  You may be given a squirt bottle to use instead of wiping to clean the perineum area after you go to the bathroom. As you start healing, you may use the squirt bottle before wiping yourself. Make sure to wipe gently.  To relieve pain caused by an episiotomy, a tear in the vagina, or swollen veins in the anus (hemorrhoids), try taking a warm sitz bath 2-3 times a day. A sitz bath is a warm water bath that is taken while you are sitting down. The water should only come up to your hips and should cover your buttocks. Breast care  Within the first few days after delivery, your breasts may feel heavy, full, and uncomfortable (breast engorgement). Milk may also leak from your breasts. Your health care provider can suggest ways to help relieve the discomfort. Breast engorgement should go away within a few days.  If you are breastfeeding: ? Wear a bra that supports your breasts and fits you well. ? Keep your nipples clean and dry. Apply creams and ointments as told by your health care provider. ? You may need to use breast pads to absorb milk that leaks from your breasts. ? You may have uterine contractions every time you breastfeed for up to several weeks after delivery. Uterine contractions help your uterus return to its normal size. ? If you have any problems with breastfeeding, work with your health care provider or lactation consultant.  If you are not breastfeeding: ? Avoid touching your breasts a lot. Doing this can make   your breasts produce more milk. ? Wear a good-fitting bra and use cold packs to help with swelling. ? Do not squeeze out (express) milk. This causes you to make more milk. Intimacy and sexuality  Ask your health care provider when you can engage in sexual activity. This may depend on: ? Your risk of infection. ? How fast you are healing. ? Your comfort and desire to engage in sexual activity.  You are able to get pregnant after delivery, even if you have not had  your period. If desired, talk with your health care provider about methods of birth control (contraception). Medicines  Take over-the-counter and prescription medicines only as told by your health care provider.  If you were prescribed an antibiotic medicine, take it as told by your health care provider. Do not stop taking the antibiotic even if you start to feel better. Activity  Gradually return to your normal activities as told by your health care provider. Ask your health care provider what activities are safe for you.  Rest as much as possible. Try to rest or take a nap while your baby is sleeping. Eating and drinking   Drink enough fluid to keep your urine pale yellow.  Eat high-fiber foods every day. These may help prevent or relieve constipation. High-fiber foods include: ? Whole grain cereals and breads. ? Brown rice. ? Beans. ? Fresh fruits and vegetables.  Do not try to lose weight quickly by cutting back on calories.  Take your prenatal vitamins until your postpartum checkup or until your health care provider tells you it is okay to stop. Lifestyle  Do not use any products that contain nicotine or tobacco, such as cigarettes and e-cigarettes. If you need help quitting, ask your health care provider.  Do not drink alcohol, especially if you are breastfeeding. General instructions  Keep all follow-up visits for you and your baby as told by your health care provider. Most women visit their health care provider for a postpartum checkup within the first 3-6 weeks after delivery. Contact a health care provider if:  You feel unable to cope with the changes that your child brings to your life, and these feelings do not go away.  You feel unusually sad or worried.  Your breasts become red, painful, or hard.  You have a fever.  You have trouble holding urine or keeping urine from leaking.  You have little or no interest in activities you used to enjoy.  You have not  breastfed at all and you have not had a menstrual period for 12 weeks after delivery.  You have stopped breastfeeding and you have not had a menstrual period for 12 weeks after you stopped breastfeeding.  You have questions about caring for yourself or your baby.  You pass a blood clot from your vagina. Get help right away if:  You have chest pain.  You have difficulty breathing.  You have sudden, severe leg pain.  You have severe pain or cramping in your lower abdomen.  You bleed from your vagina so much that you fill more than one sanitary pad in one hour. Bleeding should not be heavier than your heaviest period.  You develop a severe headache.  You faint.  You have blurred vision or spots in your vision.  You have bad-smelling vaginal discharge.  You have thoughts about hurting yourself or your baby. If you ever feel like you may hurt yourself or others, or have thoughts about taking your own life, get help  right away. You can go to the nearest emergency department or call:  Your local emergency services (911 in the [place]  A suicide crisis helpline, such as the [facility] at (972)782-1567. This is open 24 hours a day. Summary  The period of time right after you deliver your newborn up to 6-12 weeks after delivery is called the postpartum period.  Gradually return to your normal activities as told by your health care provider.  Keep all follow-up visits for you and your baby as told by your health care provider. This information is not intended to replace advice given to you by your health care provider. Make sure you discuss any questions you have with your health care provider. Document Revised: 02/06/2017 Document Reviewed: 11/17/2016 Elsevier Patient Education  2020 [employer].   Hormonal Contraception Information Hormonal contraception is a type of birth control that uses hormones to prevent pregnancy. It usually involves a  combination of the hormones estrogen and progesterone or only the hormone progesterone. Hormonal contraception works in these ways:  It thickens the mucus in the cervix, making it harder for sperm to enter the uterus.  It changes the lining of the uterus, making it harder for an egg to implant.  It may stop the ovaries from releasing eggs (ovulation). Some women who take hormonal contraceptives that contain only progesterone may continue to ovulate. Hormonal contraception cannot prevent sexually transmitted infections (STIs). Pregnancy may still occur. Estrogen and progesterone contraceptives Contraceptives that use a combination of estrogen and progesterone are available in these forms:  Pill. Pills come in different combinations of hormones. They must be taken at the same time each day. Pills can affect your period, causing you to get your period once every three months or not at all.  Patch. The patch must be worn on the lower abdomen for three weeks and then removed on the fourth.  Vaginal ring. The ring is placed in the vagina and left there for three weeks. It is then removed for one week. Progesterone contraceptives Contraceptives that use progesterone only are available in these forms:  Pill. Pills should be taken every day of the cycle.  Intrauterine device (IUD). This device is inserted into the uterus and removed or replaced every five years or sooner.  Implant. Plastic rods are placed under the skin of the upper arm. They are removed or replaced every three years or sooner.  Injection. The injection is given once every 90 days. What are the side effects? The side effects of estrogen and progesterone contraceptives include:  Nausea.  Headaches.  Breast tenderness.  Bleeding or spotting between menstrual cycles.  High blood pressure (rare).  Strokes, heart attacks, or blood clots (rare) Side effects of progesterone-only contraceptives  include:  Nausea.  Headaches.  Breast tenderness.  Unpredictable menstrual bleeding.  High blood pressure (rare). Talk to your health care provider about what side effects may affect you. Where to find more information  Ask your health care provider for more information and resources about hormonal contraception.  [place] Department of Health and Programmer, systems on Women's Health: [place] Questions to ask:  What type of hormonal contraception is right for me?  How long should I plan to use hormonal contraception?  What are the side effects of the hormonal contraception method I choose?  How can I prevent STIs while using hormonal contraception? Contact a health care provider if:  You start taking hormonal contraceptives and you develop persistent or severe side effects. Summary  Estrogen and progesterone are hormones used in many forms of birth control.  Talk to your health care provider about what side effects may affect you.  Hormonal contraception cannot prevent sexually transmitted infections (STIs).  Ask your health care provider for more information and resources about hormonal contraception. This information is not intended to replace advice given to you by your health care provider. Make sure you discuss any questions you have with your health care provider. Document Revised: 05/31/2018 Document Reviewed: 01/04/2016 Elsevier Patient Education  2020 [employer].

## 2019-03-09 NOTE — Progress Notes (Signed)
Patient came up to nurses' station tearful and stated she felt like a bad mother and a failure because she could not produce enough breast milk for her infant. Infant cluster feeding and donor milk was offered by lactation  Yesterday (03/08/19). Patient was informed around 1930 (03/08/19) that unit was out of donor milk. Patient stated that she would like some formula to help her son get through the night and settle. Explained to patient that infant can be fed a formula pre-loaded nipple shield to help with sustaining latch and help satisfy cluster feeds. Brought supplies into room and demonstrated technique and infant fell asleep before formula given.

## 2019-03-09 NOTE — Lactation Note (Addendum)
This note was copied from a baby's chart. Lactation Consultation Note  Patient Name: Brandi Norris [id number] Date: 03/09/2019 Reason for consult: Follow-up assessment;1st time breastfeeding;Difficult latch;Mother's request P2, 30 hour female infant. Infant had 4 stools and 2 voids. Mom is aware [facility] is  currently out of donor milk . Mom concerns are that her nipples are sore due to previous shallow latches.  Mom was  given coconut oil by RN and mom understands not to use coconut oil with comfort gels. LC gave comfort gels to wear in bra after infant latches at breast and mom pumps. Per mom, infant did not feed well  within the first 24 hours,  Infant had a lot of emesis that is now resolved and  was  very sleepy refusing to latch. Infant is now curing and wanting to breastfeed at 30 hours of life. Infant is now cuing to breast feed when LC entered the room, LC notice mom does have colostrum in breast when reviewing hand expression. Earlier today mom was  given 24 mm NS due breast soreness LC refitted  NS to 20 mm due to  being wrong size, mom has large breast but average nipple size.  Mom fitted with 20 mm NS prior to  latching infant on right breast using the football hold, LC repeatedly ask mom to support breast using U shape hold to help with latch,infant sustain latch after a few attempts and breastfeed for 10 minutes. Mom burped infant and re-latching infant at breast, infant was still breastfeeding when [facility] left room. LC discussed stimulation techniques to keep infant awake to increase breastfeeding duration. Mom knows to use DEBP after latching infant at breast to help with breast stimulation, induction and establishing her milk supply.  Mom will give infant back any EBM that is pumped with DEBP or hand expression. Mom will continue to breastfeed infant according to hunger cues, 8 to 12 times within 24 hours and not exceed 3 hours without breastfeeding infant. Mom knows to call RN or LC if  she needs further assistance with latching infant at breast. Maternal Data    Feeding Feeding Type: Breast Fed  LATCH Score Latch: Grasps breast easily, tongue down, lips flanged, rhythmical sucking.  Audible Swallowing: A few with stimulation  Type of Nipple: Everted at rest and after stimulation  Comfort (Breast/Nipple): Filling, red/small blisters or bruises, mild/mod discomfort  Hold (Positioning): Assistance needed to correctly position infant at breast and maintain latch.  LATCH Score: 7  Interventions Interventions: Support pillows;Adjust position;Breast compression;Position options;Skin to skin;Assisted with latch  Lactation Tools Discussed/Used Tools: Nipple Shields Nipple shield size: 20 Shell Type: Sore Pump Review: Setup, frequency, and cleaning;Milk Storage   Consult Status Consult Status: Follow-up Date: 03/09/19 Follow-up type: In-patient    Danelle Earthly 03/09/2019, 1:57 AM

## 2019-03-10 ENCOUNTER — Encounter: Payer: Medicaid Other | Admitting: Family Medicine

## 2019-03-11 NOTE — Progress Notes (Signed)
error 
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
## 2019-03-15 ENCOUNTER — Other Ambulatory Visit: Payer: Self-pay

## 2019-03-15 ENCOUNTER — Ambulatory Visit (INDEPENDENT_AMBULATORY_CARE_PROVIDER_SITE_OTHER): Payer: Medicaid Other | Admitting: Clinical

## 2019-03-15 DIAGNOSIS — Z6981 Encounter for mental health services for victim of other abuse: Secondary | ICD-10-CM | POA: Diagnosis present

## 2019-03-19 ENCOUNTER — Inpatient Hospital Stay (HOSPITAL_COMMUNITY): Admit: 2019-03-19 | Payer: Self-pay

## 2019-03-22 ENCOUNTER — Other Ambulatory Visit: Payer: Self-pay

## 2019-03-22 ENCOUNTER — Encounter: Payer: Medicaid Other | Admitting: Advanced Practice Midwife

## 2019-03-22 ENCOUNTER — Telehealth: Payer: Self-pay | Admitting: Clinical

## 2019-03-22 ENCOUNTER — Ambulatory Visit (INDEPENDENT_AMBULATORY_CARE_PROVIDER_SITE_OTHER): Payer: Medicaid Other | Admitting: Clinical

## 2019-03-22 DIAGNOSIS — F430 Acute stress reaction: Secondary | ICD-10-CM | POA: Diagnosis not present

## 2019-03-22 DIAGNOSIS — Z6981 Encounter for mental health services for victim of other abuse: Secondary | ICD-10-CM | POA: Diagnosis not present
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
## 2019-03-25 ENCOUNTER — Telehealth: Payer: Self-pay | Admitting: Clinical
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
## 2019-03-29 ENCOUNTER — Telehealth: Payer: Self-pay | Admitting: Clinical
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
## 2019-04-08 ENCOUNTER — Ambulatory Visit: Payer: Medicaid Other | Admitting: Family Medicine

## 2019-04-11 ENCOUNTER — Ambulatory Visit: Payer: Medicaid Other | Admitting: Obstetrics & Gynecology

## 2019-04-14 ENCOUNTER — Other Ambulatory Visit: Payer: Self-pay

## 2019-04-14 ENCOUNTER — Other Ambulatory Visit (HOSPITAL_COMMUNITY)
Admission: RE | Admit: 2019-04-14 | Discharge: 2019-04-14 | Disposition: A | Discharge status: Home / Self Care | Payer: Medicaid Other | Source: Ambulatory Visit | Attending: Family Medicine | Admitting: Family Medicine

## 2019-04-14 ENCOUNTER — Encounter: Payer: Self-pay | Admitting: Family Medicine

## 2019-04-14 ENCOUNTER — Ambulatory Visit (INDEPENDENT_AMBULATORY_CARE_PROVIDER_SITE_OTHER): Payer: Medicaid Other | Admitting: Family Medicine

## 2019-04-14 DIAGNOSIS — Z202 Contact with and (suspected) exposure to infections with a predominantly sexual mode of transmission: Secondary | ICD-10-CM

## 2019-04-14 DIAGNOSIS — Z113 Encounter for screening for infections with a predominantly sexual mode of transmission: Secondary | ICD-10-CM

## 2019-04-14 DIAGNOSIS — Z124 Encounter for screening for malignant neoplasm of cervix: Secondary | ICD-10-CM

## 2019-04-14 NOTE — Progress Notes (Signed)
Post Partum Exam  Brandi Norris is a 24 y.o. [phone] female who presents for a postpartum visit. She is 5 weeks postpartum following a spontaneous vaginal delivery. I have fully reviewed the prenatal and intrapartum course. The delivery was at 38.2 gestational weeks.  Anesthesia: epidural. Postpartum course has been uneventful. Baby's course has been complicated by kidney issues (seen at [facility]). Baby is feeding by breast. Bleeding no bleeding. Bowel function is normal. Bladder function is normal. Patient is sexually active. Contraception method is none. Postpartum depression screening:positive (score 11) Patient has been seeing Reather Littler.  Patient would like STD testing done. Her partner recently tested positive for syphilis.   Last pap smear done 12/05/16 and was Normal  Review of Systems Pertinent items are noted in HPI.    Objective:  Last menstrual period 06/12/2018, unknown if currently breastfeeding.  BP 130/74   Pulse 85   Ht 5\' 4"  (1.626 m)   Wt 237 lb (107.5 kg)   LMP 06/12/2018   Breastfeeding Yes   BMI 40.68 kg/m    General:  alert, cooperative and no distress  Lungs: clear to auscultation bilaterally  Heart:  regular rate and rhythm, S1, S2 normal, no murmur, click, rub or gallop  Abdomen: soft, non-tender; bowel sounds normal; no masses,  no organomegaly   Vulva:  normal  Vagina: normal vagina, no discharge, exudate, lesion, or erythema  Cervix:  multiparous appearance and no cervical motion tenderness        Assessment:    Normal postpartum exam. Exposure to Pap smear done at today's visit.   Plan:   1. Contraception: Nexplanon 2. STD screening today. 3. 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  4. Follow up next week for nexplanon placement and PCN treatment.

## 2019-04-15 LAB — RPR: RPR Ser Ql: NONREACTIVE

## 2019-04-15 LAB — HEPATITIS B SURFACE ANTIGEN: Hepatitis B Surface Ag: NEGATIVE

## 2019-04-15 LAB — HEPATITIS C ANTIBODY: Hep C Virus Ab: <0.1 s/co ratio (ref 0.0–0.9)

## 2019-04-15 LAB — HIV ANTIBODY (ROUTINE TESTING W REFLEX): HIV Screen 4th Generation wRfx: NONREACTIVE

## 2019-04-18 ENCOUNTER — Ambulatory Visit: Payer: Medicaid Other

## 2019-04-18 LAB — CYTOLOGY - PAP
Chlamydia: NEGATIVE
Comment: NEGATIVE
Comment: NEGATIVE
Comment: NORMAL
Diagnosis: NEGATIVE
High risk HPV: NEGATIVE
Neisseria Gonorrhea: NEGATIVE

## 2019-04-19 ENCOUNTER — Ambulatory Visit: Payer: Medicaid Other

## 2019-04-19 ENCOUNTER — Ambulatory Visit: Payer: Medicaid Other | Admitting: Advanced Practice Midwife

## 2019-04-19 ENCOUNTER — Telehealth: Payer: Self-pay

## 2019-04-19 NOTE — Telephone Encounter (Signed)
Patient called stating that she is unable to come in due to transportation. Patient was scheduled for a Penicillin shot for pre medication of syphilis exposure.  Patient RPR has come back negative and patient calling to see if it is still necessary for her to get treatment.   Will route to provider for input. Armandina Stammer RN

## 2019-04-19 NOTE — Telephone Encounter (Signed)
The recommendation is for her to get the antibiotic injection.

## 2019-04-26 ENCOUNTER — Telehealth: Payer: Self-pay

## 2019-04-26 MED ORDER — NORETHINDRONE 0.35 MG PO TABS: 1.0000 | ORAL_TABLET | Freq: Every day | ORAL | 11 refills | Status: DC

## 2019-04-26 NOTE — Telephone Encounter (Signed)
error 

## 2019-04-28 ENCOUNTER — Ambulatory Visit: Payer: Medicaid Other | Admitting: Obstetrics & Gynecology

## 2019-04-29 NOTE — Telephone Encounter (Signed)
Patient states due to transportation issues she will get the injection at her next visit on 05/05/2019. Armandina Stammer RN

## 2019-05-05 ENCOUNTER — Ambulatory Visit: Payer: Medicaid Other | Admitting: Family Medicine

## 2019-05-19 ENCOUNTER — Ambulatory Visit: Payer: Medicaid Other | Admitting: Family Medicine

## 2019-06-03 ENCOUNTER — Ambulatory Visit (INDEPENDENT_AMBULATORY_CARE_PROVIDER_SITE_OTHER): Payer: Medicaid Other | Admitting: Family Medicine

## 2019-06-03 ENCOUNTER — Encounter: Payer: Self-pay | Admitting: Family Medicine

## 2019-06-03 ENCOUNTER — Other Ambulatory Visit: Payer: Self-pay

## 2019-06-03 VITALS — BP 121/82 | HR 69 | Wt 239.1 lb

## 2019-06-03 DIAGNOSIS — Z30017 Encounter for initial prescription of implantable subdermal contraceptive: Secondary | ICD-10-CM | POA: Diagnosis not present

## 2019-06-03 DIAGNOSIS — Z3202 Encounter for pregnancy test, result negative: Secondary | ICD-10-CM

## 2019-06-03 LAB — POCT URINE PREGNANCY: Preg Test, Ur: NEGATIVE

## 2019-06-03 MED ORDER — ETONOGESTREL 68 MG ~~LOC~~ IMPL
68.0000 mg | DRUG_IMPLANT | Freq: Once | SUBCUTANEOUS | Status: AC
  Administered 2019-06-03: 09:00:00 68 mg via SUBCUTANEOUS

## 2019-06-03 NOTE — Patient Instructions (Signed)
Nexplanon Instructions After Insertion  Keep bandage clean and dry for 24 hours  May use ice/Tylenol/Ibuprofen for soreness or pain  If you develop fever, drainage or increased warmth from incision site-contact office immediately   

## 2019-06-03 NOTE — Progress Notes (Signed)
Nexplanon Insertion:  Patient given informed consent, signed copy in the chart, time out was performed. Pregnancy test was negative. Appropriate time out taken.  Patient's left arm was prepped and draped in the usual sterile fashion.. The ruler used to measure and mark insertion area.  Pt was prepped with alcohol swab and then injected with 5 cc of 2% lidocaine with epinephrine.  Pt was prepped with betadine, Implanon removed form packaging,  Device confirmed in needle, then inserted full length of needle and withdrawn per handbook instructions.  Device palpated by physician and patient.  Pt insertion site covered with pressure dressing.   Minimal blood loss.  Pt tolerated the procedure well.    Patient received Penicillin for Syphilis exposure at HD. She is going to be rescheduled for colposcopy

## 2019-07-07 ENCOUNTER — Ambulatory Visit (INDEPENDENT_AMBULATORY_CARE_PROVIDER_SITE_OTHER): Payer: Medicaid Other | Admitting: Family Medicine

## 2019-07-07 ENCOUNTER — Other Ambulatory Visit (HOSPITAL_COMMUNITY)
Admission: RE | Admit: 2019-07-07 | Discharge: 2019-07-07 | Disposition: A | Discharge status: Home / Self Care | Payer: Medicaid Other | Source: Ambulatory Visit | Attending: Family Medicine | Admitting: Family Medicine

## 2019-07-07 ENCOUNTER — Other Ambulatory Visit: Payer: Self-pay

## 2019-07-07 ENCOUNTER — Encounter: Payer: Self-pay | Admitting: Family Medicine

## 2019-07-07 VITALS — BP 106/66 | HR 78 | Ht 61.0 in | Wt 249.0 lb

## 2019-07-07 DIAGNOSIS — R87612 Low grade squamous intraepithelial lesion on cytologic smear of cervix (LGSIL): Secondary | ICD-10-CM | POA: Insufficient documentation

## 2019-07-07 NOTE — Progress Notes (Signed)
Patient Name: Brandi Norris, female   DOB: 1995/06/11, 24 y.o.  MRN: [id number]  Colposcopy Procedure Note:  [id number] Pregnancy status: Unknown Indications: LSIL (Done at HD - copy scanned in. Had normal PAP a month earlier) HPV:  Positive Cervical History:  Previous Abnormal Pap: none  Previous Colposcopy: none  Previous LEEP or Cryo: none  Smoking: Never Smoked Hysterectomy: No   Patient given informed consent, signed copy in the chart, time out was performed.    Exam: Vulva and Vagina grossly normal.  Cervix viewed with speculum and colposcope after application of acetic acid:  Cervix Fully Visualized Squamocolumnar Junction Visibility: Fully visualized  Acetowhite lesions: none  Other Lesions: None Punctation: Not present  Mosaicism: Not present Abnormal vasculature: No   Biopsies: none ECC: brush  Hemostasis achieved with:  n/a  Colposcopy Impression:  Benign   Patient was given post procedure instructions.  Will call patient with results.

## 2019-07-08 ENCOUNTER — Encounter: Payer: Self-pay | Admitting: Family Medicine

## 2019-07-08 DIAGNOSIS — R87612 Low grade squamous intraepithelial lesion on cytologic smear of cervix (LGSIL): Secondary | ICD-10-CM

## 2019-07-08 HISTORY — DX: Low grade squamous intraepithelial lesion on cytologic smear of cervix (LGSIL): R87.612

## 2019-07-08 LAB — SURGICAL PATHOLOGY

## 2019-10-14 ENCOUNTER — Other Ambulatory Visit: Payer: Self-pay

## 2019-10-14 ENCOUNTER — Ambulatory Visit (INDEPENDENT_AMBULATORY_CARE_PROVIDER_SITE_OTHER): Payer: Medicaid Other | Admitting: Family Medicine

## 2019-10-14 ENCOUNTER — Encounter: Payer: Self-pay | Admitting: Family Medicine

## 2019-10-14 VITALS — BP 115/91 | HR 77 | Wt 262.0 lb

## 2019-10-14 DIAGNOSIS — L02215 Cutaneous abscess of perineum: Secondary | ICD-10-CM

## 2019-10-14 DIAGNOSIS — L0291 Cutaneous abscess, unspecified: Secondary | ICD-10-CM

## 2019-10-14 MED ORDER — SULFAMETHOXAZOLE-TRIMETHOPRIM 800-160 MG PO TABS: 1.0000 | ORAL_TABLET | Freq: Two times a day (BID) | ORAL | 1 refills | Status: DC

## 2019-10-14 MED ORDER — FLUCONAZOLE 150 MG PO TABS: 150.0000 mg | ORAL_TABLET | Freq: Once | ORAL | 3 refills | Status: AC

## 2019-10-14 NOTE — Progress Notes (Signed)
Patient has Nexplanon and has no period. Patient states that she noticed boil on her Mons and patient states that she has had some pus comes out that has strong malodor. Armandina Stammer RN

## 2019-10-14 NOTE — Progress Notes (Signed)
° °  Subjective:    Patient ID: Brandi Norris, female    DOB: 08-06-1995, 24 y.o.   MRN: [id number]  HPI Patient seen for boil that developed on Mons. Started yesterday. Draining but has foul odor to it. Mildly tender.    Review of Systems     Objective:   Physical Exam Vitals reviewed.  Constitutional:      Appearance: Normal appearance.  Abdominal:    Neurological:     Mental Status: She is alert.       Assessment & Plan:  1. Abscess Mons abscess. Bactrim. Will give diflucan to cover yeast infection.

## 2019-12-09 ENCOUNTER — Other Ambulatory Visit: Payer: Self-pay

## 2019-12-09 ENCOUNTER — Ambulatory Visit: Payer: Medicaid Other

## 2019-12-09 ENCOUNTER — Other Ambulatory Visit (HOSPITAL_COMMUNITY)
Admission: RE | Admit: 2019-12-09 | Discharge: 2019-12-09 | Disposition: A | Discharge status: Home / Self Care | Payer: Medicaid Other | Source: Ambulatory Visit | Attending: Family Medicine | Admitting: Family Medicine

## 2019-12-09 VITALS — BP 112/89 | HR 87

## 2019-12-09 DIAGNOSIS — N898 Other specified noninflammatory disorders of vagina: Secondary | ICD-10-CM | POA: Diagnosis not present

## 2019-12-09 NOTE — Progress Notes (Signed)
Patient presents for nurse visit. She is having vaginal discharge that has increase over the last week. Yesterday she noticed some blood mix in with the discharge. Patient states discharge has odor to it.  Denies any changes to routine soaps, detergents, etc.   Asked patient if she would like testing for GC/Chl and she states "might as well".   Will treat once culture results are back in. Armandina Stammer RN

## 2019-12-12 LAB — CERVICOVAGINAL ANCILLARY ONLY
Bacterial Vaginitis (gardnerella): NEGATIVE
Candida Glabrata: NEGATIVE
Candida Vaginitis: NEGATIVE
Chlamydia: NEGATIVE
Comment: NEGATIVE
Comment: NEGATIVE
Comment: NEGATIVE
Comment: NEGATIVE
Comment: NEGATIVE
Comment: NORMAL
Neisseria Gonorrhea: NEGATIVE
Trichomonas: NEGATIVE

## 2019-12-12 NOTE — Progress Notes (Signed)
Chart reviewed - agree with CMA/RN documentation.  ° °

## 2020-01-24 ENCOUNTER — Ambulatory Visit (INDEPENDENT_AMBULATORY_CARE_PROVIDER_SITE_OTHER): Payer: No Typology Code available for payment source | Admitting: Clinical

## 2020-01-24 ENCOUNTER — Other Ambulatory Visit: Payer: Self-pay

## 2020-01-24 DIAGNOSIS — F603 Borderline personality disorder: Secondary | ICD-10-CM

## 2020-01-24 DIAGNOSIS — Z658 Other specified problems related to psychosocial circumstances: Secondary | ICD-10-CM

## 2020-01-24 NOTE — Patient Instructions (Addendum)
Center for [facility] at [facility] [address] [place], [place] [address] [phone] (main office) [phone] (Demri Poulton's office)   Your Behavioral Health Team you last saw:  Gayla Medicus, MD (Psychiatry) on 06/08/2019 [phone] Sterling Big, LCSW (Therapy) on 05/26/2019 [phone]  24/7 Behavioral Health Crisis Center:  [facility]: [facility] (24/7): [phone]   [address] [place], [place] [address]   Housing Resources                    [address] (serves [place], [place], [place], [place], [place], [place], [place], [place], [place], [place], [place], [place], and [place] counties) [address] [place], [place] [address] [phone] [email or web address]  **Rental assistance, Home Rehabilitation,Weatherization Assistance Program, Chief Financial Officer, Housing Voucher Program
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
## 2020-02-15 ENCOUNTER — Telehealth: Payer: Self-pay | Admitting: Clinical
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
## 2020-02-15 NOTE — Telephone Encounter (Signed)
error 

## 2020-04-23 IMAGING — US US MFM OB DETAIL+14 WK
2 series · 13 of 28 positions shown · non-contrast
Comparison: none

[Series 1: us mfm ob detail+14 wk · 6 of 74 slices shown (1 of 2)]
[im 6/74]
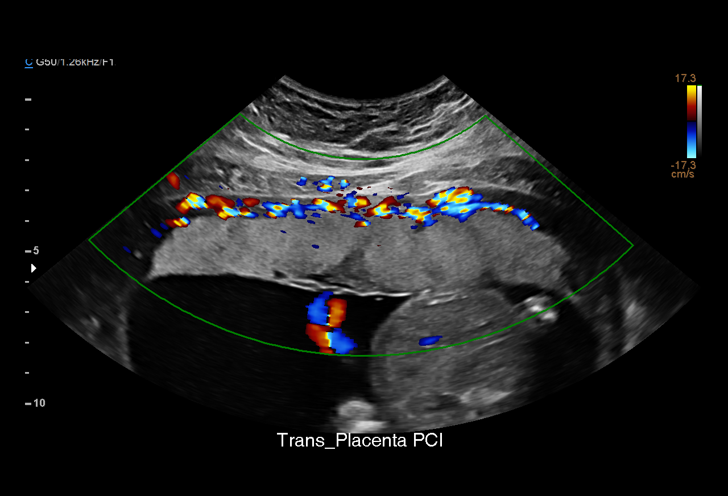
[im 17/74]
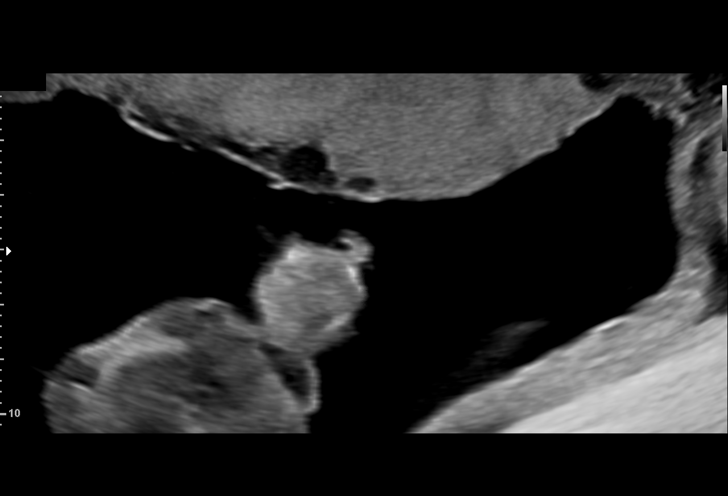
[im 29/74]
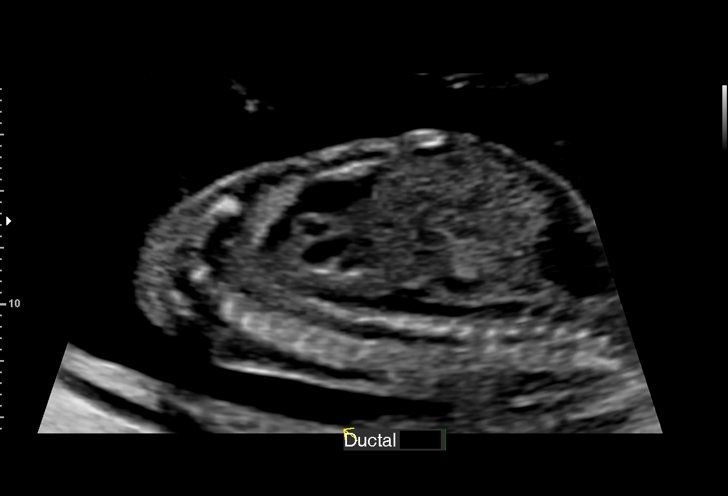
[im 40/74]
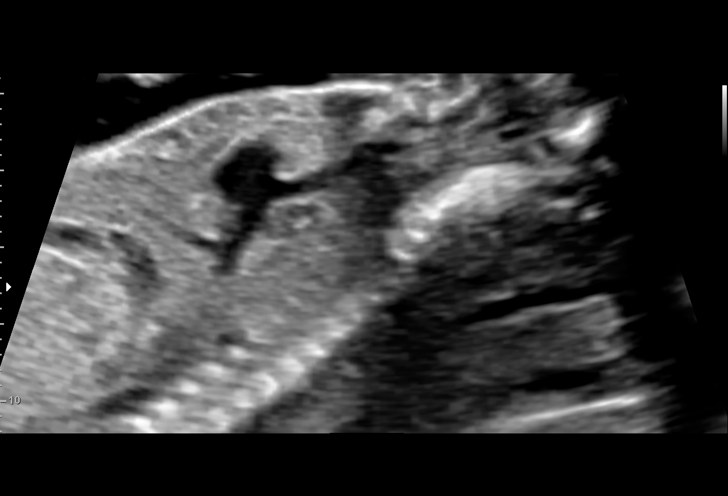
[im 51/74]
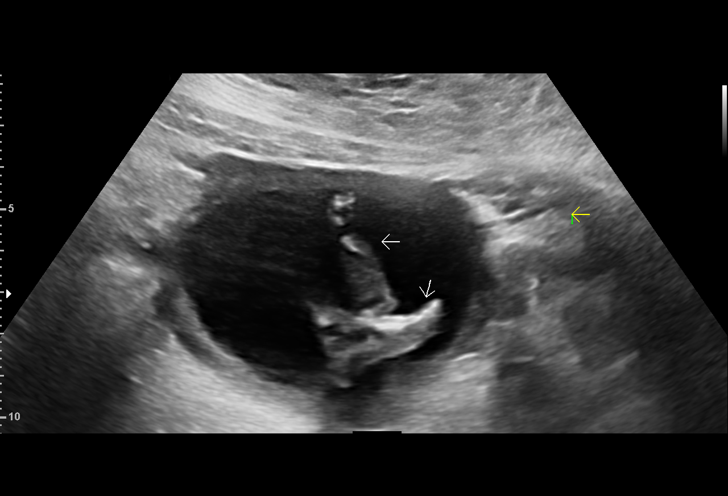
[im 62/74]
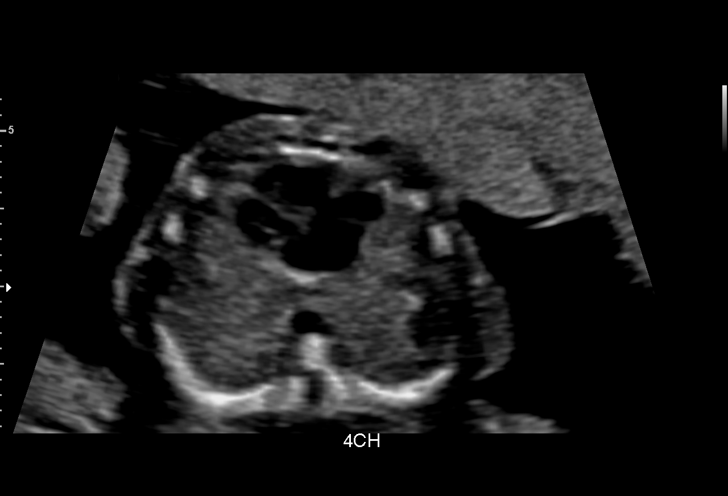

[Series 1: us mfm ob detail+14 wk · 7 of 72 slices shown (2 of 2)]
[im 1/72]
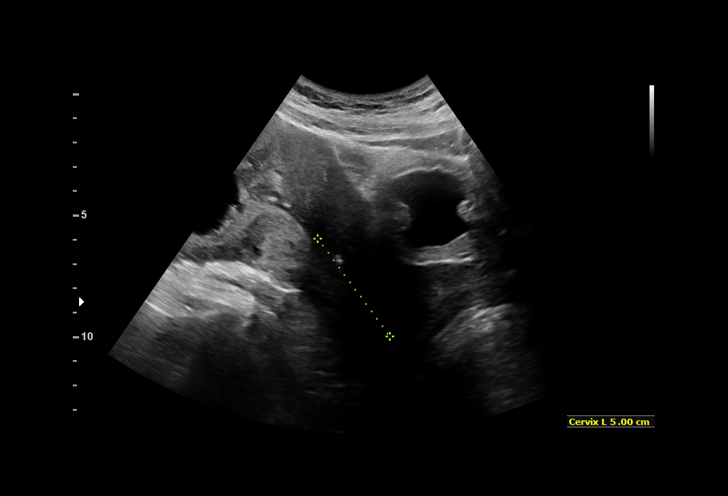
[im 11/72]
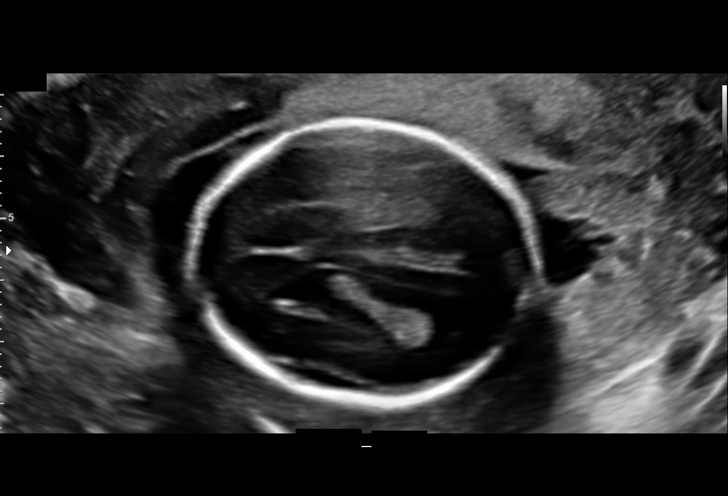
[im 22/72]
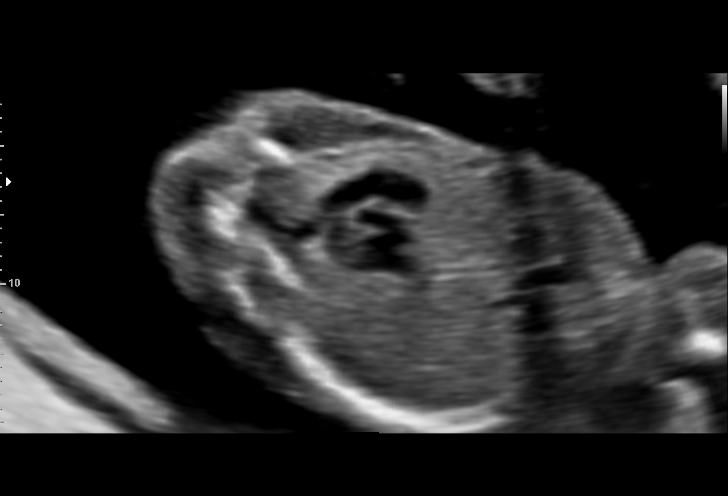
[im 33/72]
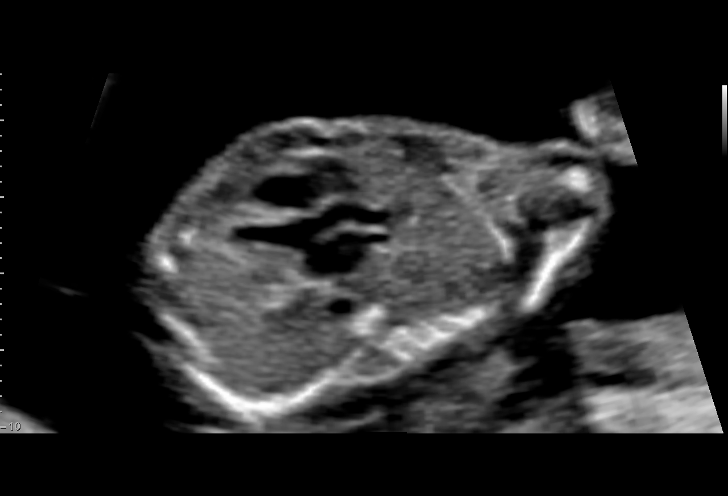
[im 44/72]
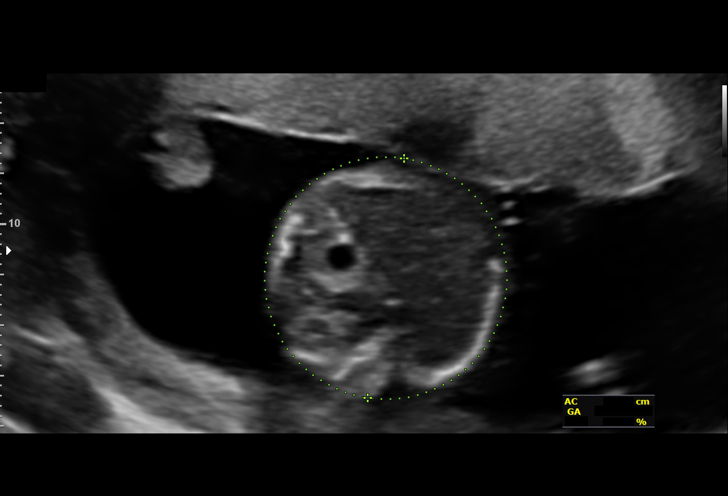
[im 55/72]
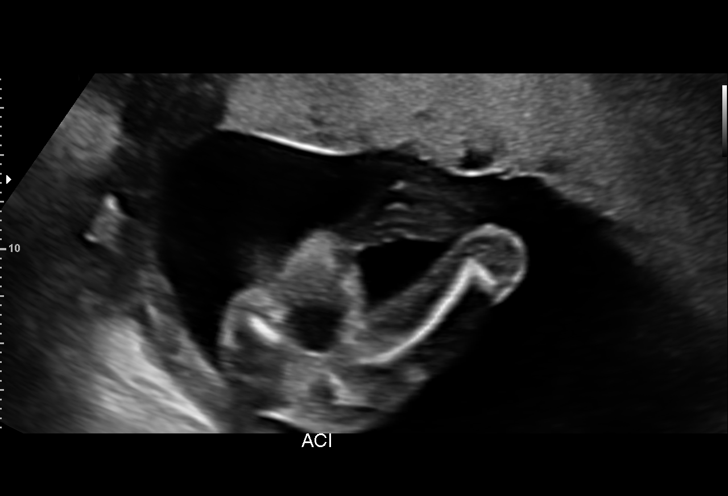
[im 66/72]
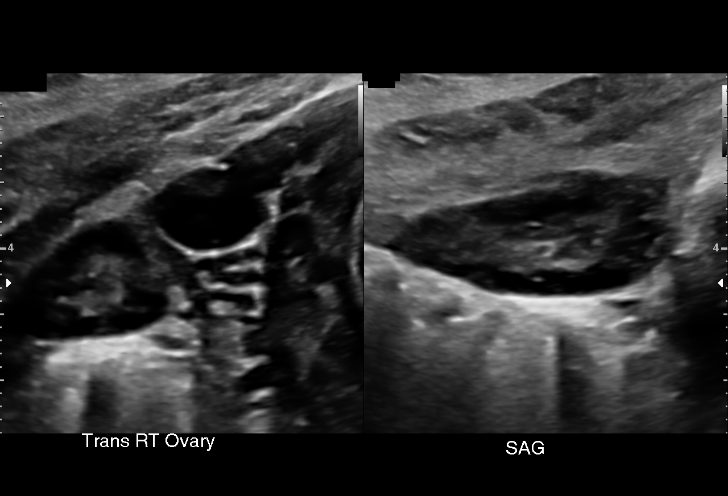

[13 of 28 positions shown; findings below may reference images not displayed]

YUNGBULL

                                                       AUNTYJATTY
 ----------------------------------------------------------------------

 ----------------------------------------------------------------------
Indications

  Encounter for antenatal screening for
  malformations (Low Risk NIPS)
  Silent carrier for Alpha-Thalassemia
  Increased carrier risk for SMA
  Poor obstetric history: Previous
  preeclampsia / eclampsia/gestational HTN
  Maternal morbid obesity (Pre- pregnancy
  BMI 46)
  19 weeks gestation of pregnancy
 ----------------------------------------------------------------------
Vital Signs

 BMI:
Fetal Evaluation

 Num Of Fetuses:         1
 Fetal Heart Rate(bpm):  129
 Cardiac Activity:       Observed
 Presentation:           Variable
 Placenta:               Anterior
 P. Cord Insertion:      Visualized

 Amniotic Fluid
 AFI FV:      Within normal limits

                             Largest Pocket(cm)

Biometry

 BPD:      46.6  mm     G. Age:  20w 1d         66  %    CI:         80.1   %    70 - 86
                                                         FL/HC:      20.3   %    16.8 -
 HC:      164.5  mm     G. Age:  19w 1d         19  %    HC/AC:      1.13        1.09 -
 AC:       146   mm     G. Age:  19w 6d         51  %    FL/BPD:     71.7   %
 FL:       33.4  mm     G. Age:  20w 3d         70  %    FL/AC:      22.9   %    20 - 24
 HUM:      30.7  mm     G. Age:  20w 1d         64  %
 CER:      20.1  mm     G. Age:  19w 1d         38  %
 NFT:       4.7  mm

 LV:        6.3  mm
 CM:        3.2  mm

 Est. FW:     330  gm    0 lb 12 oz      66  %
OB History

 Gravidity:    4         Term:   1        Prem:   0        SAB:   2
 Living:       1
Gestational Age

 LMP:           19w 5d        Date:  06/12/18                 EDD:   03/19/19
 U/S Today:     19w 6d                                        EDD:   03/18/19
 Best:          19w 5d     Det. By:  LMP  (06/12/18)          EDD:   03/19/19
Anatomy

 Cranium:               Appears normal         Aortic Arch:            Appears normal
 Cavum:                 Appears normal         Ductal Arch:            Appears normal
 Ventricles:            Appears normal         Diaphragm:              Not well visualized
 Choroid Plexus:        Appears normal         Stomach:                Appears normal, left
                                                                       sided
 Cerebellum:            Appears normal         Abdomen:                Appears normal
 Posterior Fossa:       Appears normal         Abdominal Wall:         Appears nml (cord
                                                                       insert, abd wall)
 Nuchal Fold:           Appears normal         Cord Vessels:           Appears normal (3
                                                                       vessel cord)
 Face:                  Appears normal         Kidneys:                Appear normal
                        (orbits and profile)
 Lips:                  Appears normal         Bladder:                Appears normal
 Thoracic:              Appears normal         Spine:                  Not well visualized
 Heart:                 Appears normal         Upper Extremities:      Appears normal
                        (4CH, axis, and
                        situs)
 RVOT:                  Appears normal         Lower Extremities:      Appears normal
 LVOT:                  Appears normal

 Other:  Male gender. Nasal bone visualized. Technically difficult due to
         maternal habitus and fetal position.
Cervix Uterus Adnexa

 Cervix
 Length:              5  cm.
 Normal appearance by transabdominal scan.

 Uterus
 No abnormality visualized.
 Left Ovary
 Within normal limits.

 Right Ovary
 Within normal limits.

 Cul De Sac
 No free fluid seen.

 Adnexa
 No abnormality visualized.
Impression

 We performed fetal anatomy scan. No makers of
 aneuploidies or fetal structural defects are seen. Fetal
 biometry is consistent with her previously-established dates.
 Amniotic fluid is normal and good fetal activity is seen.
 Patient understands the limitations of ultrasound in detecting
 fetal anomalies.
 Maternal obesity imposes limitations on the resolution of
 images, and failure to detect fetal anomalies is more common
 in obese pregnant women. As maternal obesity makes
 clinical assessment of fetal growth difficult, we recommend
 serial growth scans until delivery.

 Patient has an increased carrier risk for Spinal Muscular
 Atrophy. She is a silent carrier for Alpha-Thalasemia (aa/a-).

 Patient met with our genetic counselor today after ultrasound.
 You will be receiving a separate letter.
Recommendations

 -An appointment was made for her to return in 4 weeks for
 completion of fetal anatomy (spine).
                 Lombardi, Jashan

## 2020-05-24 ENCOUNTER — Ambulatory Visit (INDEPENDENT_AMBULATORY_CARE_PROVIDER_SITE_OTHER): Payer: Medicaid Other | Admitting: Family Medicine

## 2020-05-24 ENCOUNTER — Encounter: Payer: Self-pay | Admitting: Family Medicine

## 2020-05-24 ENCOUNTER — Other Ambulatory Visit: Payer: Self-pay

## 2020-05-24 VITALS — BP 111/61 | HR 100 | Wt 279.0 lb

## 2020-05-24 DIAGNOSIS — N6312 Unspecified lump in the right breast, upper inner quadrant: Secondary | ICD-10-CM

## 2020-05-24 NOTE — Progress Notes (Signed)
   Subjective:    Patient ID: Brandi Norris, female    DOB: 1996-02-01, 25 y.o.   MRN: [id number]  HPI Noticed small breast lump about 3 weeks ago. No pain, skin changes.   Review of Systems     Objective:   Physical Exam Vitals reviewed.  Constitutional:      Appearance: Normal appearance.  Chest:    Neurological:     Mental Status: She is alert.       Assessment & Plan:  1. Breast lump on right side at 2 o'clock position Likely cyst. Diagnostic mammo ordered. - MM Digital Diagnostic Unilat R; Future

## 2020-06-19 ENCOUNTER — Ambulatory Visit
Admission: RE | Admit: 2020-06-19 | Discharge: 2020-06-19 | Disposition: A | Discharge status: Home / Self Care | Payer: Medicaid Other | Source: Ambulatory Visit | Attending: Family Medicine | Admitting: Family Medicine

## 2020-06-19 ENCOUNTER — Other Ambulatory Visit: Payer: Self-pay

## 2020-06-19 DIAGNOSIS — N6312 Unspecified lump in the right breast, upper inner quadrant: Secondary | ICD-10-CM

## 2020-10-11 ENCOUNTER — Other Ambulatory Visit: Payer: Self-pay

## 2020-10-11 ENCOUNTER — Ambulatory Visit (INDEPENDENT_AMBULATORY_CARE_PROVIDER_SITE_OTHER): Payer: Medicaid Other | Admitting: Family Medicine

## 2020-10-11 ENCOUNTER — Encounter: Payer: Self-pay | Admitting: Family Medicine

## 2020-10-11 VITALS — BP 129/88 | HR 90 | Wt 269.0 lb

## 2020-10-11 DIAGNOSIS — Z3046 Encounter for surveillance of implantable subdermal contraceptive: Secondary | ICD-10-CM

## 2020-10-11 MED ORDER — NORGESTIMATE-ETH ESTRADIOL 0.25-35 MG-MCG PO TABS: 1.0000 | ORAL_TABLET | Freq: Every day | ORAL | 3 refills | Status: DC

## 2020-10-11 NOTE — Progress Notes (Signed)
   Subjective:    Patient ID: Brandi Norris, female    DOB: 07-28-1995, 25 y.o.   MRN: [id number]  HPI Patient seen for concerns regarding her Nexplanon.  Patient had an infection of her left forearm that required the use of antibiotics, steroids, antihistamines.  She has had some paresthesias down her left arm when resting on her left arm.  She has been palpating her Nexplanon and feels that it "bends" is worried that it is broken.   Having some breakthrough bleeding with the Nexplanon.   Review of Systems     Objective:   Physical Exam Vitals reviewed.  Skin:    General: Skin is warm.     Capillary Refill: Capillary refill takes less than 2 seconds.     Comments: Nexplanon palpated in its entirety.  It does not appear broken.  Psychiatric:        Mood and Affect: Mood normal.        Behavior: Behavior normal.        Thought Content: Thought content normal.       Assessment & Plan:   1. Encounter for surveillance of Nexplanon subdermal contraceptive The Nexplanon is intact.  I discussed with the patient there is some flexibility to the Nexplanon and that it does not appear to be broken. I offered to remove it and replace it or add OCPs to help with breakthrough bleeding. Patient amenable to that.

## 2020-11-15 ENCOUNTER — Ambulatory Visit (INDEPENDENT_AMBULATORY_CARE_PROVIDER_SITE_OTHER): Payer: Medicaid Other | Admitting: Family Medicine

## 2020-11-15 ENCOUNTER — Other Ambulatory Visit: Payer: Self-pay

## 2020-11-15 ENCOUNTER — Other Ambulatory Visit (HOSPITAL_COMMUNITY)
Admission: RE | Admit: 2020-11-15 | Discharge: 2020-11-15 | Disposition: A | Discharge status: Home / Self Care | Payer: Medicaid Other | Source: Ambulatory Visit | Attending: Family Medicine | Admitting: Family Medicine

## 2020-11-15 ENCOUNTER — Encounter: Payer: Self-pay | Admitting: Family Medicine

## 2020-11-15 VITALS — BP 134/77 | HR 78 | Wt 270.0 lb

## 2020-11-15 DIAGNOSIS — Z113 Encounter for screening for infections with a predominantly sexual mode of transmission: Secondary | ICD-10-CM | POA: Insufficient documentation

## 2020-11-15 DIAGNOSIS — Z01419 Encounter for gynecological examination (general) (routine) without abnormal findings: Secondary | ICD-10-CM | POA: Insufficient documentation

## 2020-11-15 DIAGNOSIS — Z3046 Encounter for surveillance of implantable subdermal contraceptive: Secondary | ICD-10-CM | POA: Diagnosis not present

## 2020-11-15 MED ORDER — ETONOGESTREL-ETHINYL ESTRADIOL 0.12-0.015 MG/24HR VA RING: VAGINAL_RING | VAGINAL | 4 refills | Status: DC

## 2020-11-15 NOTE — Progress Notes (Signed)
Patient present for annual exam.patient desires for nexplanon removal.  Armandina Stammer RN

## 2020-11-16 NOTE — Progress Notes (Signed)
GYNECOLOGY ANNUAL PREVENTATIVE CARE ENCOUNTER NOTE  Subjective:   Brandi Norris is a 25 y.o. [phone] female here for a routine annual gynecologic exam.  Current complaints: continued breakthrough bleeding with nexplanon. Would like it removed and restart nuvaring.   Denies abnormal vaginal bleeding, discharge, pelvic pain, problems with intercourse or other gynecologic concerns.    Gynecologic History No LMP recorded. Patient has had an implant. Patient is sexually active  Contraception: NuvaRing vaginal inserts Last Pap: 2021. Results were: normal Last mammogram: n/a.   Obstetric History OB History  Gravida Para Term Preterm AB Living  $Remov'4 2 2   2 2  'aSPxle$ SAB IAB Ectopic Multiple Live Births  2     0 2    # Outcome Date GA Lbr Len/2nd Weight Sex Delivery Anes PTL Lv  4 Term 03/07/19 [redacted]w[redacted]d 11:42 / 00:22 8 lb 4.6 oz (3.759 kg) M Vag-Spont EPI  LIV  3 Term 07/01/17 [redacted]w[redacted]d 27:30 / 00:40 7 lb 3.3 oz (3.27 kg) M Vag-Spont EPI  LIV  2 SAB           1 SAB             Past Medical History:  Diagnosis Date   LGSIL on Pap smear of cervix 07/08/2019   Miscarriage    Pregnancy induced hypertension     Past Surgical History:  Procedure Laterality Date   DILATION AND CURETTAGE OF UTERUS     WISDOM TOOTH EXTRACTION      Current Outpatient Medications on File Prior to Visit  Medication Sig Dispense Refill   acetaminophen (TYLENOL) 325 MG tablet Take 650 mg by mouth daily as needed for mild pain (pain).  (Patient not taking: No sig reported)     AMBULATORY NON FORMULARY MEDICATION 1 Device by Other route once a week. Blood pressure cuff for monitored regularly at home  ICD 10: O09.90 (Patient not taking: No sig reported) 1 kit 0   calcium carbonate (TUMS - DOSED IN MG ELEMENTAL CALCIUM) 500 MG chewable tablet Chew 3 tablets by mouth as needed for indigestion or heartburn. (Patient not taking: No sig reported)     escitalopram (LEXAPRO) 10 MG tablet Take 10 mg by mouth daily. (Patient not  taking: Reported on 10/11/2020)     ibuprofen (ADVIL) 600 MG tablet Take 1 tablet (600 mg total) by mouth every 6 (six) hours. (Patient not taking: No sig reported) 30 tablet 0   Prenatal Vit-Fe Phos-FA-Omega (VITAFOL GUMMIES) 3.33-0.333-34.8 MG CHEW Chew 34.8 mg by mouth 3 (three) times daily. (Patient not taking: No sig reported) 90 tablet 10   sulfamethoxazole-trimethoprim (BACTRIM DS) 800-160 MG tablet Take 1 tablet by mouth 2 (two) times daily. (Patient not taking: No sig reported) 14 tablet 1   traZODone (DESYREL) 100 MG tablet Take 100 mg by mouth at bedtime. (Patient not taking: Reported on 10/11/2020)     No current facility-administered medications on file prior to visit.    Allergies  Allergen Reactions   Madelaine Bhat Isothiocyanate] Hives    Social History   Socioeconomic History   Marital status: Single    Spouse name: Not on file   Number of children: Not on file   Years of education: Not on file   Highest education level: Not on file  Occupational History   Not on file  Tobacco Use   Smoking status: Never   Smokeless tobacco: Never  Vaping Use   Vaping Use: Never used  Substance and Sexual Activity  Alcohol use: No   Drug use: No   Sexual activity: Yes    Birth control/protection: None  Other Topics Concern   Not on file  Social History Narrative   Not on file   Social Determinants of Health   Financial Resource Strain: Not on file  Food Insecurity: Not on file  Transportation Needs: Not on file  Physical Activity: Not on file  Stress: Not on file  Social Connections: Not on file  Intimate Partner Violence: Not on file    Family History  Problem Relation Age of Onset   Stroke Mother    Fibromyalgia Mother    Neuropathy Mother    Bursitis Mother    Osteoarthritis Mother    Aneurysm Father    Brain cancer Maternal Aunt    Throat cancer Maternal Uncle    Breast cancer Paternal Aunt    Diabetes Maternal Grandmother    Hypertension Maternal  Grandmother    Heart disease Paternal Grandmother     The following portions of the patient's history were reviewed and updated as appropriate: allergies, current medications, past family history, past medical history, past social history, past surgical history and problem list.  Review of Systems Pertinent items are noted in HPI.   Objective:  BP 134/77   Pulse 78   Wt 270 lb (122.5 kg)   BMI 51.02 kg/m  Wt Readings from Last 3 Encounters:  11/15/20 270 lb (122.5 kg)  10/11/20 269 lb (122 kg)  05/24/20 279 lb (126.6 kg)     Chaperone present during exam  CONSTITUTIONAL: Well-developed, well-nourished female in no acute distress.  HENT:  Normocephalic, atraumatic, External right and left ear normal. Oropharynx is clear and moist EYES: Conjunctivae and EOM are normal. Pupils are equal, round, and reactive to light. No scleral icterus.  NECK: Normal range of motion, supple, no masses.  Normal thyroid.   CARDIOVASCULAR: Normal heart rate noted, regular rhythm RESPIRATORY: Clear to auscultation bilaterally. Effort and breath sounds normal, no problems with respiration noted. BREASTS: Symmetric in size. No masses, skin changes, nipple drainage, or lymphadenopathy. ABDOMEN: Soft, normal bowel sounds, no distention noted.  No tenderness, rebound or guarding.  PELVIC: Normal appearing external genitalia; normal appearing vaginal mucosa and cervix.  No abnormal discharge noted.  Normal uterine size, no other palpable masses, no uterine or adnexal tenderness. MUSCULOSKELETAL: Normal range of motion. No tenderness.  No cyanosis, clubbing, or edema.  2+ distal pulses. SKIN: Skin is warm and dry. No rash noted. Not diaphoretic. No erythema. No pallor. NEUROLOGIC: Alert and oriented to person, place, and time. Normal reflexes, muscle tone coordination. No cranial nerve deficit noted. PSYCHIATRIC: Normal mood and affect. Normal behavior. Normal judgment and thought content.  Nexplanon  Removal:  Patient given informed consent for removal of her Implanon, time out was performed.  Signed copy in the chart.  Appropriate time out taken. Implanon site identified.  Area prepped in usual sterile fashon. 5 cc of 1% lidocaine was used to anesthetize the area at the distal end of the implant. A small stab incision was made right beside the implant on the distal portion.  The implanon rod was grasped using hemostats and removed without difficulty.  There was less than 3 cc blood loss. There were no complications.  A small amount of antibiotic ointment and steri-strips were applied over the small incision.  A pressure bandage was applied to reduce any bruising.  The patient tolerated the procedure well and was given post procedure instructions.  Assessment:  Annual gynecologic examination with pap smear   Plan:  1. Well Woman Exam Pt requested PAP this year. Will follow up results of pap smear and manage accordingly. STD testing discussed. Patient requested testing Nexplanon removed and  - Cytology - PAP( [place])  2. Screening for STD (sexually transmitted disease) - Cytology - PAP( )  3. Nexplanon removal Start nuvaring  Routine preventative health maintenance measures emphasized. Please refer to After Visit Summary for other counseling recommendations.    Loma Boston, [place] for [employer]

## 2020-11-19 MED ORDER — SULFAMETHOXAZOLE-TRIMETHOPRIM 800-160 MG PO TABS: 1.0000 | ORAL_TABLET | Freq: Two times a day (BID) | ORAL | 1 refills | Status: DC

## 2020-11-20 LAB — CYTOLOGY - PAP
Chlamydia: NEGATIVE
Comment: NEGATIVE
Comment: NORMAL
Diagnosis: NEGATIVE
Neisseria Gonorrhea: NEGATIVE

## 2020-11-22 ENCOUNTER — Other Ambulatory Visit: Payer: Self-pay

## 2020-11-22 ENCOUNTER — Ambulatory Visit (INDEPENDENT_AMBULATORY_CARE_PROVIDER_SITE_OTHER): Payer: Medicaid Other | Admitting: Family Medicine

## 2020-11-22 ENCOUNTER — Encounter: Payer: Self-pay | Admitting: Family Medicine

## 2020-11-22 VITALS — BP 129/77 | HR 76

## 2020-11-22 DIAGNOSIS — T148XXA Other injury of unspecified body region, initial encounter: Secondary | ICD-10-CM

## 2020-11-22 DIAGNOSIS — L089 Local infection of the skin and subcutaneous tissue, unspecified: Secondary | ICD-10-CM

## 2020-11-22 NOTE — Progress Notes (Signed)
   Subjective:    Patient ID: Brandi Norris, female    DOB: 12-Jun-1995, 25 y.o.   MRN: [id number]  HPI Patient seen for injection of the Nexplanon site.  She been prescribed antibiotics: Bactrim.  As she was already on the Bactrim and has a history of MRSA, we increased the Bactrim to 2 DS twice a day.  It appears that the area is improving.  She has less discharge.   Review of Systems     Objective:   Physical Exam Vitals reviewed.  Skin:    General: Skin is warm and dry.     Comments: Slight defect in skin at sight. No erythema. Slightly tender. No drainage.      Assessment & Plan:  1. Wound infection Finish antibiotics. F/u as needed

## 2021-01-02 ENCOUNTER — Other Ambulatory Visit: Payer: Self-pay

## 2021-01-02 MED ORDER — ELLA 30 MG PO TABS: 1.0000 | ORAL_TABLET | Freq: Once | ORAL | 0 refills | Status: AC

## 2021-01-02 NOTE — Progress Notes (Signed)
Patient called stating she needs the Plan B called Brandi Norris.[name] is for obese women who need emergency contraception.  Consulted with Dr. Adrian Blackwater and approved prescription. Armandina Stammer RN

## 2021-01-16 ENCOUNTER — Other Ambulatory Visit (HOSPITAL_COMMUNITY)
Admission: RE | Admit: 2021-01-16 | Discharge: 2021-01-16 | Disposition: A | Discharge status: Home / Self Care | Payer: Medicaid Other | Source: Ambulatory Visit | Attending: Family Medicine | Admitting: Family Medicine

## 2021-01-16 ENCOUNTER — Other Ambulatory Visit: Payer: Self-pay

## 2021-01-16 ENCOUNTER — Ambulatory Visit (INDEPENDENT_AMBULATORY_CARE_PROVIDER_SITE_OTHER): Payer: Medicaid Other

## 2021-01-16 VITALS — BP 92/65 | HR 83 | Wt 276.0 lb

## 2021-01-16 DIAGNOSIS — R399 Unspecified symptoms and signs involving the genitourinary system: Secondary | ICD-10-CM | POA: Diagnosis not present

## 2021-01-16 DIAGNOSIS — Z113 Encounter for screening for infections with a predominantly sexual mode of transmission: Secondary | ICD-10-CM | POA: Insufficient documentation

## 2021-01-16 LAB — POCT URINALYSIS DIPSTICK
Bilirubin, UA: NEGATIVE
Blood, UA: NEGATIVE
Glucose, UA: NEGATIVE
Ketones, UA: NEGATIVE
Nitrite, UA: NEGATIVE
Protein, UA: NEGATIVE
Spec Grav, UA: 1.015 (ref 1.010–1.025)
Urobilinogen, UA: 0.2 E.U./dL
pH, UA: 7.5 (ref 5.0–8.0)

## 2021-01-16 NOTE — Progress Notes (Signed)
SUBJECTIVE: Brandi Norris is a 25 y.o. female who complains of urinary frequency, urgency and dysuria x 3 days, without flank pain, fever, chills, or abnormal vaginal discharge or bleeding. Pt also requests STI testing.  OBJECTIVE: Appears well, in no apparent distress.  Vital signs are normal. Urine dipstick shows positive for WBC's.    ASSESSMENT: Burning with urination   PLAN: Treatment per orders.  Call or return to clinic prn if these symptoms worsen or fail to improve as anticipated.

## 2021-01-17 LAB — GC/CHLAMYDIA PROBE AMP (~~LOC~~) NOT AT ARMC
Chlamydia: POSITIVE — AB
Comment: NEGATIVE
Comment: NORMAL
Neisseria Gonorrhea: NEGATIVE

## 2021-01-17 LAB — CERVICOVAGINAL ANCILLARY ONLY
Bacterial Vaginitis (gardnerella): POSITIVE — AB
Candida Glabrata: NEGATIVE
Candida Vaginitis: NEGATIVE
Chlamydia: POSITIVE — AB
Comment: NEGATIVE
Comment: NEGATIVE
Comment: NEGATIVE
Comment: NEGATIVE
Comment: NEGATIVE
Comment: NORMAL
Neisseria Gonorrhea: NEGATIVE
Trichomonas: NEGATIVE

## 2021-01-17 LAB — RPR: RPR Ser Ql: NONREACTIVE

## 2021-01-17 LAB — HEPATITIS B SURFACE ANTIGEN: Hepatitis B Surface Ag: NEGATIVE

## 2021-01-17 LAB — HEPATITIS C ANTIBODY: Hep C Virus Ab: 0.3 s/co ratio (ref 0.0–0.9)

## 2021-01-17 LAB — HIV ANTIBODY (ROUTINE TESTING W REFLEX): HIV Screen 4th Generation wRfx: NONREACTIVE

## 2021-01-17 MED ORDER — METRONIDAZOLE 0.75 % VA GEL: 1.0000 | Freq: Every day | VAGINAL | 1 refills | Status: DC

## 2021-01-17 MED ORDER — AZITHROMYCIN 250 MG PO TABS: 1000.0000 mg | ORAL_TABLET | Freq: Once | ORAL | 1 refills | Status: AC

## 2021-01-17 NOTE — Progress Notes (Signed)
Called patient with results: positive for BV and chlamydia. Patient prefers vaginal gel for BV. Will give azithromycin for chlamydia - discussed expedited partner treatment. No sex for 2 weeks while both are being treated.

## 2021-01-21 ENCOUNTER — Telehealth: Payer: Self-pay

## 2021-01-21 ENCOUNTER — Other Ambulatory Visit: Payer: Self-pay

## 2021-01-21 DIAGNOSIS — B379 Candidiasis, unspecified: Secondary | ICD-10-CM

## 2021-01-21 DIAGNOSIS — N390 Urinary tract infection, site not specified: Secondary | ICD-10-CM

## 2021-01-21 LAB — URINE CULTURE

## 2021-01-21 MED ORDER — FLUCONAZOLE 150 MG PO TABS: ORAL_TABLET | ORAL | 1 refills | Status: DC

## 2021-01-21 MED ORDER — NITROFURANTOIN MONOHYD MACRO 100 MG PO CAPS
100.0000 mg | ORAL_CAPSULE | Freq: Two times a day (BID) | ORAL | 0 refills | Status: DC
Start: 2021-01-21 — End: 2021-07-11

## 2021-01-21 NOTE — Telephone Encounter (Signed)
Pt called the office back. Pt made aware that she has a UTI and Macrobid was sent to her pharmacy. Understanding was voiced. Nate Perri l Maynard David, CMA

## 2021-01-21 NOTE — Telephone Encounter (Signed)
Called pt to inform her of UTI. Left message for pt to call the office back. Macrobid 100 mg I capsule PO BID x 7 days was sent to her pharmacy. Arthor Gorter l [place] Haydon, CMA

## 2021-06-19 ENCOUNTER — Ambulatory Visit (INDEPENDENT_AMBULATORY_CARE_PROVIDER_SITE_OTHER): Payer: Medicaid Other | Admitting: Family Medicine

## 2021-06-19 ENCOUNTER — Encounter: Payer: Self-pay | Admitting: Family Medicine

## 2021-06-19 VITALS — BP 93/61 | HR 82

## 2021-06-19 DIAGNOSIS — L680 Hirsutism: Secondary | ICD-10-CM | POA: Diagnosis not present

## 2021-06-19 DIAGNOSIS — Z8742 Personal history of other diseases of the female genital tract: Secondary | ICD-10-CM | POA: Diagnosis not present

## 2021-06-19 DIAGNOSIS — Z23 Encounter for immunization: Secondary | ICD-10-CM

## 2021-06-19 MED ORDER — XULANE 150-35 MCG/24HR TD PTWK: 1.0000 | MEDICATED_PATCH | TRANSDERMAL | 3 refills | Status: DC

## 2021-06-19 NOTE — Progress Notes (Signed)
Patient would like her horomones checked. Patient thinks she may have PCOS. Patient states that she is having painful periods. Patient also notes hairs on her chin. Armandina Stammer RN  ?

## 2021-06-19 NOTE — Progress Notes (Signed)
? ?  Subjective:  ? ? Patient ID: Brandi Norris, female    DOB: 1995-07-03, 26 y.o.   MRN: [id number] ? ?HPI ?26 year old seen for hirsutism that she has noticed over the past several months.  She is noticing hair growth on her chin and neck with an occasional hair on her chest.  Her cycles have fluctuated a little bit more and range from 27-33 days. Does have a little more acne. Has been trying to lose weight - about 10 pounds in the past 4 months.  ? ?She does have 2-3 partners. Had new partner last night. Used condoms. ? ?Review of Systems ? ?   ?Objective:  ? Physical Exam ?Vitals reviewed.  ?Constitutional:   ?   Appearance: Normal appearance.  ?Skin: ?   General: Skin is warm and dry.  ?   Capillary Refill: Capillary refill takes less than 2 seconds.  ?   Comments: Class 1 hirsutism on neck and chin  ?Neurological:  ?   General: No focal deficit present.  ?   Mental Status: She is alert.  ?Psychiatric:     ?   Mood and Affect: Mood normal.     ?   Behavior: Behavior normal.     ?   Thought Content: Thought content normal.  ? ? ?   ?Assessment & Plan:  ?1. Hirsutism ?Screen for PCOS as below. Discussed options - continue weight loss, beginning combined contraception, depilation. Patient would like to start xulane. Would also like to continue with weight loss.  ?- TestT+TestF+SHBG ?- LH ?- Estrogens, Total ?- Thyroid Panel With TSH ?- Hemoglobin A1c ?- HPV 9-valent vaccine,Recombinat ? ?2. History of abnormal cervical Pap smear ?- HPV 9-valent vaccine,Recombinat ? ?Discussed high risk sexual behavior. Continue using condoms, frequent screening for STI. ? ?

## 2021-06-24 LAB — THYROID PANEL WITH TSH
Free Thyroxine Index: 2.1 (ref 1.2–4.9)
T3 Uptake Ratio: 28 % (ref 24–39)
T4, Total: 7.6 ug/dL (ref 4.5–12.0)
TSH: 1.44 u[IU]/mL (ref 0.450–4.500)

## 2021-06-24 LAB — TESTT+TESTF+SHBG
Sex Hormone Binding: 44.9 nmol/L (ref 24.6–122.0)
Testosterone, Free: 2.1 pg/mL (ref 0.0–4.2)
Testosterone, Total, LC/MS: 44 ng/dL (ref 10.0–55.0)

## 2021-06-24 LAB — HEMOGLOBIN A1C
Est. average glucose Bld gHb Est-mCnc: 111 mg/dL
Hgb A1c MFr Bld: 5.5 % (ref 4.8–5.6)

## 2021-06-24 LAB — ESTROGENS, TOTAL: Estrogen: 210 pg/mL

## 2021-06-24 LAB — LUTEINIZING HORMONE: LH: 5.8 m[IU]/mL

## 2021-07-11 ENCOUNTER — Telehealth: Payer: Self-pay

## 2021-07-11 ENCOUNTER — Other Ambulatory Visit: Payer: Self-pay | Admitting: Family Medicine

## 2021-07-11 ENCOUNTER — Telehealth: Payer: Self-pay | Admitting: Family Medicine

## 2021-07-11 MED ORDER — ETONOGESTREL-ETHINYL ESTRADIOL 0.12-0.015 MG/24HR VA RING: VAGINAL_RING | VAGINAL | 3 refills | Status: DC

## 2021-07-11 MED ORDER — ELLA 30 MG PO TABS: 1.0000 | ORAL_TABLET | Freq: Once | ORAL | 6 refills | Status: AC

## 2021-07-11 NOTE — Telephone Encounter (Signed)
-----   Message from Marti Sleigh, [place] sent at 07/11/2021  8:35 AM EDT ----- Regarding: Rx Request Pt left message on VM requesting a Rx but I couldn't understand what medication she was requesting.

## 2021-07-11 NOTE — Telephone Encounter (Signed)
Pt called requesting a Rx for Ella(Plan B). A message was sent to the provider. Vertis Bauder l Deontrey Massi, CMA

## 2021-07-11 NOTE — Telephone Encounter (Signed)
Its sent to the pharmacy. There are some refills. Can you see if she has started to use the Xulane patch?

## 2021-07-11 NOTE — Telephone Encounter (Signed)
-----   Message from Valentina Lucks, [place] sent at 07/11/2021  8:55 AM EDT ----- Regarding: Plan B Rx This pt wants [facility] sent to her pharmacy.

## 2021-07-11 NOTE — Progress Notes (Signed)
Switch from xulane patch to nuvaring. Xulane patch keeps falling off.

## 2021-08-22 ENCOUNTER — Ambulatory Visit (INDEPENDENT_AMBULATORY_CARE_PROVIDER_SITE_OTHER): Payer: Medicaid Other

## 2021-08-22 VITALS — BP 115/75 | HR 83 | Wt 259.0 lb

## 2021-08-22 DIAGNOSIS — Z23 Encounter for immunization: Secondary | ICD-10-CM | POA: Diagnosis not present

## 2021-08-22 NOTE — Progress Notes (Signed)
Brandi Norris here for  HPV   Injection.  Injection administered without complication. Patient will return in  4 months  for next injection.  Earland Reish l [name], CMA 08/22/2021  9:55 AM

## 2021-12-04 ENCOUNTER — Ambulatory Visit: Payer: Medicaid Other | Admitting: Family Medicine

## 2021-12-26 ENCOUNTER — Ambulatory Visit: Payer: Medicaid Other

## 2021-12-26 ENCOUNTER — Other Ambulatory Visit (HOSPITAL_COMMUNITY)
Admission: RE | Admit: 2021-12-26 | Discharge: 2021-12-26 | Disposition: A | Discharge status: Home / Self Care | Payer: Medicaid Other | Source: Ambulatory Visit | Attending: Family Medicine | Admitting: Family Medicine

## 2021-12-26 ENCOUNTER — Encounter: Payer: Self-pay | Admitting: Family Medicine

## 2021-12-26 ENCOUNTER — Ambulatory Visit: Payer: Medicaid Other | Admitting: Family Medicine

## 2021-12-26 VITALS — BP 125/86 | HR 73 | Wt 263.0 lb

## 2021-12-26 DIAGNOSIS — Z113 Encounter for screening for infections with a predominantly sexual mode of transmission: Secondary | ICD-10-CM

## 2021-12-26 DIAGNOSIS — Z01419 Encounter for gynecological examination (general) (routine) without abnormal findings: Secondary | ICD-10-CM | POA: Insufficient documentation

## 2021-12-26 DIAGNOSIS — Z23 Encounter for immunization: Secondary | ICD-10-CM

## 2021-12-26 NOTE — Progress Notes (Signed)
   ANNUAL EXAM Patient name: Brandi Norris MRN [id number]  Date of birth: 05-25-1995 Chief Complaint:   Annual Exam  History of Present Illness:   Brandi Norris is a 26 y.o.  [phone]  female  being seen today for a routine annual exam.  Current complaints: gets sore throat after oral sex.  Patient's last menstrual period was 10/01/2021.   Last pap  Diagnosis  Date Value Ref Range Status  11/15/2020   Final   - Negative for intraepithelial lesion or malignancy (NILM)   H/O abnormal pap: yes LSIL at [facility] (scanned in) Last mammogram: n/a.      No data to display               No data to display           Review of Systems:   Pertinent items are noted in HPI Denies any headaches, blurred vision, fatigue, shortness of breath, chest pain, abdominal pain, abnormal vaginal discharge/itching/odor/irritation, problems with periods, bowel movements, urination, or intercourse unless otherwise stated above. Pertinent History Reviewed:  Reviewed past medical,surgical, social and family history.  Reviewed problem list, medications and allergies. Physical Assessment:   Vitals:   12/26/21 1058  BP: 125/86  Pulse: 73  Weight: 263 lb (119.3 kg)  Body mass index is 49.69 kg/m.        Physical Examination:   General appearance - well appearing, and in no distress  Mental status - alert, oriented to person, place, and time  Psych:  She has a normal mood and affect  Skin - warm and dry, normal color, no suspicious lesions noted  Chest - effort normal, all lung fields clear to auscultation bilaterally  Heart - normal rate and regular rhythm  Neck:  midline trachea, no thyromegaly or nodules  Breasts - breasts appear normal, no suspicious masses, no skin or nipple changes or axillary nodes  Abdomen - soft, nontender, nondistended, no masses or organomegaly  Pelvic - VULVA: normal appearing vulva with no masses, tenderness or lesions  VAGINA: normal appearing vagina with normal  color and discharge, no lesions  CERVIX: normal appearing cervix without discharge or lesions, no CMT  Thin prep pap is done HR HPV cotesting  UTERUS: uterus is felt to be normal size, shape, consistency and nontender   ADNEXA: No adnexal masses or tenderness noted.  Extremities:  No swelling or varicosities noted  Chaperone present for exam  Assessment & Plan:  1. Well woman exam with routine gynecological exam - Hepatitis C Antibody - Hepatitis B Surface AntiGEN - RPR - HIV antibody (with reflex) - Cytology - PAP( [place])  2. Screening for STD (sexually transmitted disease) Will check throat culture for GC/CT - Hepatitis C Antibody - Hepatitis B Surface AntiGEN - RPR - HIV antibody (with reflex)   Labs/procedures today:   Orders Placed This Encounter  Procedures   Hepatitis C Antibody   Hepatitis B Surface AntiGEN   RPR   HIV antibody (with reflex)    Meds: No orders of the defined types were placed in this encounter.   Follow-up: No follow-ups on file.  Levie Heritage, DO 12/26/2021 11:17 AM

## 2021-12-27 LAB — HIV ANTIBODY (ROUTINE TESTING W REFLEX): HIV Screen 4th Generation wRfx: NONREACTIVE

## 2021-12-27 LAB — GC/CHLAMYDIA PROBE AMP (~~LOC~~) NOT AT ARMC
Chlamydia: NEGATIVE
Comment: NEGATIVE
Comment: NORMAL
Neisseria Gonorrhea: NEGATIVE

## 2021-12-27 LAB — RPR: RPR Ser Ql: NONREACTIVE

## 2021-12-27 LAB — CYTOLOGY - PAP
Chlamydia: NEGATIVE
Comment: NEGATIVE
Comment: NORMAL
Diagnosis: NEGATIVE
Neisseria Gonorrhea: NEGATIVE

## 2021-12-27 LAB — HEPATITIS B SURFACE ANTIGEN: Hepatitis B Surface Ag: NEGATIVE

## 2021-12-27 LAB — HEPATITIS C ANTIBODY: Hep C Virus Ab: NONREACTIVE

## 2022-05-19 ENCOUNTER — Encounter: Payer: Self-pay | Admitting: Family Medicine

## 2022-05-19 ENCOUNTER — Other Ambulatory Visit: Payer: Self-pay

## 2022-05-19 DIAGNOSIS — Z3040 Encounter for surveillance of contraceptives, unspecified: Secondary | ICD-10-CM

## 2022-05-19 MED ORDER — ETONOGESTREL-ETHINYL ESTRADIOL 0.12-0.015 MG/24HR VA RING: VAGINAL_RING | VAGINAL | 3 refills | Status: AC

## 2022-05-19 NOTE — Progress Notes (Signed)
Patient sent Mychart message requesting a refill for Nuvaring Insert vaginally and leave in place for 3 consecutive weeks, then remove for 1 week was sent to patient's pharmacy.  Baker Moronta l Maloni Musleh, CMA

## 2023-03-26 ENCOUNTER — Ambulatory Visit: Payer: Medicaid Other | Admitting: Family Medicine

## 2023-05-14 ENCOUNTER — Ambulatory Visit: Payer: Medicaid Other | Admitting: Family Medicine

## 2023-05-21 ENCOUNTER — Ambulatory Visit: Admitting: Obstetrics & Gynecology

## 2023-05-21 DIAGNOSIS — N9489 Other specified conditions associated with female genital organs and menstrual cycle: Secondary | ICD-10-CM | POA: Diagnosis not present

## 2023-05-21 DIAGNOSIS — N898 Other specified noninflammatory disorders of vagina: Secondary | ICD-10-CM

## 2023-05-21 NOTE — Progress Notes (Signed)
 Patient ID: Brandi Norris, female   DOB: 11/12/1995, 28 y.o.   MRN: [id number]  8 months with vaginal mass   HPI Brandi Norris is a 28 y.o. female.  [id number] Patient's last menstrual period was 04/29/2023 (exact date). She has had some BTB using Xulane  patch o/w no problem with bleeding. 8 months ago she noticed pea-size mass at the introitus which is not painful but may notice it on penetration  HPI  Past Medical History:  Diagnosis Date   LGSIL on Pap smear of cervix 07/08/2019   Miscarriage    Pregnancy induced hypertension     Past Surgical History:  Procedure Laterality Date   DILATION AND CURETTAGE OF UTERUS     WISDOM TOOTH EXTRACTION      Family History  Problem Relation Age of Onset   Stroke Mother    Fibromyalgia Mother    Neuropathy Mother    Bursitis Mother    Osteoarthritis Mother    Aneurysm Father    Brain cancer Maternal Aunt    Throat cancer Maternal Uncle    Breast cancer Paternal Aunt    Diabetes Maternal Grandmother    Hypertension Maternal Grandmother    Heart disease Paternal Grandmother     Social History Social History   Tobacco Use   Smoking status: Never   Smokeless tobacco: Never  Vaping Use   Vaping status: Never Used  Substance Use Topics   Alcohol use: No   Drug use: No    Allergies  Allergen Reactions   Mustard [Allyl Isothiocyanate] Hives    Current Outpatient Medications  Medication Sig Dispense Refill   cariprazine (VRAYLAR) 3 MG capsule Take by mouth.     etonogestrel-ethinyl estradiol (NUVARING) 0.12-0.015 MG/24HR vaginal ring Insert vaginally and leave in place for 3 consecutive weeks, then remove for 1 week. 3 each 3   hydrOXYzine (ATARAX) 25 MG tablet Take by mouth.     SUMAtriptan (IMITREX) 50 MG tablet Take by mouth.     traZODone (DESYREL) 50 MG tablet Take 50 mg by mouth at bedtime.     No current facility-administered medications for this visit.    Review of Systems Review of Systems  Constitutional:  Negative.   Respiratory: Negative.    Cardiovascular: Negative.   Gastrointestinal: Negative.   Genitourinary:  Negative for pelvic pain, vaginal bleeding, vaginal discharge and vaginal pain.    Blood pressure 131/76, pulse (!) 107, weight 262 lb (118.8 kg), last menstrual period 04/29/2023.  Physical Exam Physical Exam Vitals and nursing note reviewed. Exam conducted with a chaperone present.  Constitutional:      Appearance: She is obese.  HENT:     Head: Normocephalic and atraumatic.  Cardiovascular:     Rate and Rhythm: Normal rate.  Pulmonary:     Effort: Pulmonary effort is normal.  Abdominal:     Palpations: Abdomen is soft.  Genitourinary:      Comments: Posterior firm inclusion cyst c/w sebum-containing 8 mm not tender Neurological:     Mental Status: She is alert.  Psychiatric:        Mood and Affect: Mood normal.        Behavior: Behavior normal.     Data Reviewed   Assessment Sebaceous vaginal inclusion cyst  Plan She will consider if her sx warrant excision. Will return for annual exam    Scheryl Darter 05/21/2023, 9:37 AM
